# Patient Record
Sex: Male | Born: 1955 | Race: White | Hispanic: No | Smoking: Never smoker
Health system: Southern US, Community
[De-identification: ages and names within clinical notes are randomized; demographics above are authoritative.]

## PROBLEM LIST (undated history)

## (undated) ENCOUNTER — Ambulatory Visit (HOSPITAL_BASED_OUTPATIENT_CLINIC_OR_DEPARTMENT_OTHER)

## (undated) DIAGNOSIS — N442 Benign cyst of testis: Secondary | ICD-10-CM

## (undated) DIAGNOSIS — R7303 Prediabetes: Secondary | ICD-10-CM

## (undated) DIAGNOSIS — E785 Hyperlipidemia, unspecified: Secondary | ICD-10-CM

## (undated) DIAGNOSIS — I10 Essential (primary) hypertension: Secondary | ICD-10-CM

## (undated) DIAGNOSIS — G4733 Obstructive sleep apnea (adult) (pediatric): Secondary | ICD-10-CM

## (undated) DIAGNOSIS — R7309 Other abnormal glucose: Secondary | ICD-10-CM

## (undated) DIAGNOSIS — F5221 Male erectile disorder: Secondary | ICD-10-CM

## (undated) DIAGNOSIS — R2243 Localized swelling, mass and lump, lower limb, bilateral: Secondary | ICD-10-CM

## (undated) DIAGNOSIS — E669 Obesity, unspecified: Secondary | ICD-10-CM

## (undated) DIAGNOSIS — E291 Testicular hypofunction: Secondary | ICD-10-CM

## (undated) HISTORY — DX: Hyperlipidemia, unspecified: E78.5

## (undated) HISTORY — DX: Other abnormal glucose: R73.09

## (undated) HISTORY — DX: Obstructive sleep apnea (adult) (pediatric): G47.33

## (undated) HISTORY — DX: Testicular hypofunction: E29.1

## (undated) HISTORY — DX: Localized swelling, mass and lump, lower limb, bilateral: R22.43

## (undated) HISTORY — DX: Essential (primary) hypertension: I10

## (undated) HISTORY — DX: Male erectile disorder: F52.21

## (undated) HISTORY — DX: Prediabetes: R73.03

## (undated) HISTORY — PX: KNEE ARTHROSCOPY: SUR90

## (undated) HISTORY — DX: Benign cyst of testis: N44.2

## (undated) HISTORY — DX: Obesity, unspecified: E66.9

---

## 1990-12-06 HISTORY — PX: VASECTOMY: SHX75

## 2016-01-27 DIAGNOSIS — R7309 Other abnormal glucose: Secondary | ICD-10-CM

## 2016-01-27 DIAGNOSIS — E785 Hyperlipidemia, unspecified: Secondary | ICD-10-CM

## 2016-01-27 DIAGNOSIS — G4733 Obstructive sleep apnea (adult) (pediatric): Secondary | ICD-10-CM

## 2016-01-27 HISTORY — DX: Hyperlipidemia, unspecified: E78.5

## 2016-01-27 HISTORY — DX: Other abnormal glucose: R73.09

## 2016-01-27 HISTORY — DX: Obstructive sleep apnea (adult) (pediatric): G47.33

## 2016-02-02 DIAGNOSIS — E669 Obesity, unspecified: Secondary | ICD-10-CM

## 2016-02-02 DIAGNOSIS — E291 Testicular hypofunction: Secondary | ICD-10-CM

## 2016-02-02 DIAGNOSIS — N442 Benign cyst of testis: Secondary | ICD-10-CM

## 2016-02-02 DIAGNOSIS — F5221 Male erectile disorder: Secondary | ICD-10-CM

## 2016-02-02 HISTORY — DX: Male erectile disorder: F52.21

## 2016-02-02 HISTORY — DX: Obesity, unspecified: E66.9

## 2016-02-02 HISTORY — DX: Testicular hypofunction: E29.1

## 2016-02-02 HISTORY — DX: Benign cyst of testis: N44.2

## 2018-08-01 DIAGNOSIS — I119 Hypertensive heart disease without heart failure: Secondary | ICD-10-CM | POA: Insufficient documentation

## 2018-08-01 DIAGNOSIS — I10 Essential (primary) hypertension: Secondary | ICD-10-CM

## 2018-08-01 DIAGNOSIS — R2243 Localized swelling, mass and lump, lower limb, bilateral: Secondary | ICD-10-CM

## 2018-08-01 HISTORY — DX: Essential (primary) hypertension: I10

## 2018-08-01 HISTORY — DX: Localized swelling, mass and lump, lower limb, bilateral: R22.43

## 2018-08-07 NOTE — Progress Notes (Signed)
Cardiology Office Note:    Date:  08/08/2018   ID:  George Hatfield, DOB 04/16/1956, MRN [id number]  PCP:  Everlean Cherry, MD  Cardiologist:  Norman Herrlich, MD    Referring MD: Everlean Cherry, MD    ASSESSMENT:    1. SOB (shortness of breath)   2. Hypertension, essential   3. Pure hyperglyceridemia   4. OSA (obstructive sleep apnea)    PLAN:    In order of problems listed above:  1. Shortness of breath this is anginal equivalent we reviewed the options for evaluation including stress modalities or cardiac CTA and after discussion of options benefits and risk he elects to undergo cardiac CTA.  He has no dye allergy is normal renal function.  If abnormal I will bring him back to my office for discussion results if normal to see me as needed. 2. Stable continue current treatment 3. Continue his current over-the-counter fish oil and does not achieve target I would use prescription Lovaza or vascepa 4. Stable continue CPAP managed by pulmonary   Next appointment: As needed   Medication Adjustments/Labs and Tests Ordered: Current medicines are reviewed at length with the patient today.  Concerns regarding medicines are outlined above.  Orders Placed This Encounter  Procedures  . CT CORONARY MORPH W/CTA COR W/SCORE W/CA W/CM &/OR WO/CM  . CT CORONARY FRACTIONAL FLOW RESERVE DATA PREP  . CT CORONARY FRACTIONAL FLOW RESERVE FLUID ANALYSIS  . EKG 12-Lead   No orders of the defined types were placed in this encounter.   Chief Complaint  Patient presents with  . Shortness of Breath    History of Present Illness:    George Hatfield is a 62 y.o. male with a hx of hypertension, obstructive sleep apnea on CPAP and exertional SOB  last seen by me more than 3 years ago. Compliance with diet, lifestyle and medications: Yes, he has started over-the-counter fish oil for high triglycerides  This [place] while [place] he had an episode that raise concern for heart disease.  He  developed severe limiting exertional shortness of breath that forced him to stop pause and every time he did start activities it would recur.  He does not have shortness of breath other times he had no cough or wheezing was unable to keep up with his wife.  His cardiovascular risk factors include hypertension elevated triglycerides and sleep apnea which is treated and compliant with CPAP.  He has had no typical exertional angina his symptoms certainly sound like anginal equivalent shortness of breath.  He has no history of congenital rheumatic heart disease edema orthopnea.  No history of lung disease. Past Medical History:  Diagnosis Date  . Abnormal glucose 01/27/2016  . 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 Hyperlipidemia 01/27/2016  . Hypertension, essential 08/01/2018  . Localized swelling of both lower legs 08/01/2018  . 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 Obesity, unspecified 02/02/2016  . OSA (obstructive sleep apnea) 01/27/2016   PSG 06/01/16 AHI 11.7  . 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 KNEE ARTHROSCOPY    . VASECTOMY  1992    Current Medications: Current Meds  Medication Sig  . Cholecalciferol (VITAMIN D PO) Take 1 tablet by mouth daily.  . Coenzyme Q10 (CO Q 10 PO) Take 1 tablet by mouth daily.  . Cyanocobalamin (VITAMIN B-12 PO) Take 1 tablet by mouth daily.  Marland Kitchen lisinopril (PRINIVIL,ZESTRIL) 20 MG tablet TAKE ONE TABLET  BY MOUTH EVERY DAY  . Multiple Vitamins-Minerals (ZINC PO) Take 1 tablet by mouth daily.  . Nutritional Supplements (DHEA PO) Take 1 tablet by mouth daily.  . Omega-3 Fatty Acids (FISH OIL) 1000 MG CAPS Take 1 capsule by mouth daily.   . Pregnenolone POWD Take 1 tablet by mouth daily.  Marland Kitchen VITAMIN K PO Take 1 tablet by mouth daily.     Allergies:   Patient has no known allergies.   Social History   Socioeconomic History  . Marital status: Married    Spouse name: Not on file  . Number of children:  Not on file  . Years of education: Not on file  . Highest education level: Not on file  Occupational History  . Not on file  Social Needs  . Financial resource strain: Not on file  . Food insecurity:    Worry: Not on file    Inability: Not on file  . Transportation needs:    Medical: Not on file    Non-medical: Not on file  Tobacco Use  . Smoking status: Never Smoker  . Smokeless tobacco: Never Used  Substance and Sexual Activity  . Alcohol use: Yes    Frequency: Never    Comment: 4 drinks per week  . Drug use: Never  . Sexual activity: Not on file  Lifestyle  . Physical activity:    Days per week: Not on file    Minutes per session: Not on file  . Stress: Not on file  Relationships  . Social connections:    Talks on phone: Not on file    Gets together: Not on file    Attends religious service: Not on file    Active member of club or organization: Not on file    Attends meetings of clubs or organizations: Not on file    Relationship status: Not on file  Other Topics Concern  . Not on file  Social History Narrative  . Not on file     Family History: The patient's family history includes Alzheimer's disease in his father; Breast cancer in his mother; Diabetes in his father and mother; Hypertension in his mother; Stroke in his father. ROS:   Please see the history of present illness.    All other systems reviewed and are negative.  EKGs/Labs/Other Studies Reviewed:    The following studies were reviewed today:  EKG:  EKG ordered today.  The ekg ordered today demonstrates sinus rhythm normal  Recent Labs:   08/01/18 CMP normal except Glu 107, Chol 152 TG 684 HDL 43 LDL 82  CBC normal   Physical Exam:    VS:  BP (!) 142/86 (BP Location: Left Arm, Patient Position: Sitting, Cuff Size: Large)   Pulse (!) 49   Ht 6\' 3"  (1.905 m)   Wt 266 lb (120.7 kg)   SpO2 96%   BMI 33.25 kg/m     Wt Readings from Last 3 Encounters:  08/08/18 266 lb (120.7 kg)     GEN:   Well nourished, well developed in no acute distress HEENT: Normal NECK: No JVD; No carotid bruits LYMPHATICS: No lymphadenopathy CARDIAC: RRR, no murmurs, rubs, gallops RESPIRATORY:  Clear to auscultation without rales, wheezing or rhonchi  ABDOMEN: Soft, non-tender, non-distended MUSCULOSKELETAL:  No edema; No deformity  SKIN: Warm and dry NEUROLOGIC:  Alert and oriented x 3 PSYCHIATRIC:  Normal affect    Signed, Norman Herrlich, MD  08/08/2018 11:33 AM    [facility]

## 2018-08-08 ENCOUNTER — Ambulatory Visit: Payer: BLUE CROSS/BLUE SHIELD | Admitting: Cardiology

## 2018-08-08 ENCOUNTER — Encounter (INDEPENDENT_AMBULATORY_CARE_PROVIDER_SITE_OTHER): Payer: Self-pay

## 2018-08-08 ENCOUNTER — Encounter: Payer: Self-pay | Admitting: Cardiology

## 2018-08-08 VITALS — BP 142/86 | HR 49 | Ht 75.0 in | Wt 266.0 lb

## 2018-08-08 DIAGNOSIS — R0602 Shortness of breath: Secondary | ICD-10-CM | POA: Diagnosis not present

## 2018-08-08 DIAGNOSIS — I10 Essential (primary) hypertension: Secondary | ICD-10-CM | POA: Diagnosis not present

## 2018-08-08 DIAGNOSIS — E781 Pure hyperglyceridemia: Secondary | ICD-10-CM | POA: Diagnosis not present

## 2018-08-08 DIAGNOSIS — G4733 Obstructive sleep apnea (adult) (pediatric): Secondary | ICD-10-CM

## 2018-08-08 DIAGNOSIS — R079 Chest pain, unspecified: Secondary | ICD-10-CM | POA: Insufficient documentation

## 2018-08-08 DIAGNOSIS — Z01818 Encounter for other preprocedural examination: Secondary | ICD-10-CM

## 2018-08-08 MED ORDER — METOPROLOL TARTRATE 50 MG PO TABS: 50.0000 mg | ORAL_TABLET | Freq: Once | ORAL | 0 refills | Status: DC

## 2018-08-08 NOTE — Patient Instructions (Addendum)
Medication Instructions:  Your physician recommends that you continue on your current medications as directed. Please refer to the Current Medication list given to you today.   Labwork: Your physician recommends that you return for lab work in 3-7 days before cardiac CTA: BMP. You can return to our office, no appointment needed. We are open M-F 8-5.   Testing/Procedures: You had an EKG today.   Please arrive at the [facility] main entrance of [facility] at xx:xx AM (30-45 minutes prior to test start time)  [facility] [address] [place], [place] [address] [phone]  Proceed to the [facility] Radiology Department (First Floor).  Please follow these instructions carefully (unless otherwise directed):  Hold all erectile dysfunction medications at least 48 hours prior to test.  On the Night Before the Test: . Drink plenty of water. . Do not consume any caffeinated/decaffeinated beverages or chocolate 12 hours prior to your test. . Do not take any antihistamines 12 hours prior to your test.  On the Day of the Test: . Drink plenty of water. Do not drink any water within one hour of the test. . Do not eat any food 4 hours prior to the test. . You may take your regular medications prior to the test. . IF NOT ON A BETA BLOCKER - Take 50 mg of lopressor (metoprolol) one hour before the test.  After the Test: . Drink plenty of water. . After receiving IV contrast, you may experience a mild flushed feeling. This is normal. . On occasion, you may experience a mild rash up to 24 hours after the test. This is not dangerous. If this occurs, you can take Benadryl 25 mg and increase your fluid intake. . If you experience trouble breathing, this can be serious. If it is severe call 911 IMMEDIATELY. If it is mild, please call our office.   Follow-Up: Your physician recommends that you schedule a follow-up appointment as needed if symptoms fail or worsen to  improve.   If you need a refill on your cardiac medications before your next appointment, please call your pharmacy.   Thank you for choosing [facility]! Mady Gemma, RN [phone]   Cardiac CT Angiogram A cardiac CT angiogram is a procedure to look at the heart and the area around the heart. It may be done to help find the cause of chest pains or other symptoms of heart disease. During this procedure, a large X-ray machine, called a CT scanner, takes detailed pictures of the heart and the surrounding area after a dye (contrast material) has been injected into blood vessels in the area. The procedure is also sometimes called a coronary CT angiogram, coronary artery scanning, or CTA. A cardiac CT angiogram allows the health care provider to see how well blood is flowing to and from the heart. The health care provider will be able to see if there are any problems, such as:  Blockage or narrowing of the coronary arteries in the heart.  Fluid around the heart.  Signs of weakness or disease in the muscles, valves, and tissues of the heart.  Tell a health care provider about:  Any allergies you have. This is especially important if you have had a previous allergic reaction to contrast dye.  All medicines you are taking, including vitamins, herbs, eye drops, creams, and over-the-counter medicines.  Any blood disorders you have.  Any surgeries you have had.  Any medical conditions you have.  Whether you are pregnant  or may be pregnant.  Any anxiety disorders, chronic pain, or other conditions you have that may increase your stress or prevent you from lying still. What are the risks? Generally, this is a safe procedure. However, problems may occur, including:  Bleeding.  Infection.  Allergic reactions to medicines or dyes.  Damage to other structures or organs.  Kidney damage from the dye or contrast that is used.  Increased risk of cancer from radiation exposure.  This risk is low. Talk with your health care provider about: ? The risks and benefits of testing. ? How you can receive the lowest dose of radiation.  What happens before the procedure?  Wear comfortable clothing and remove any jewelry, glasses, dentures, and hearing aids.  Follow instructions from your health care provider about eating and drinking. This may include: ? For 12 hours before the test - avoid caffeine. This includes tea, coffee, soda, energy drinks, and diet pills. Drink plenty of water or other fluids that do not have caffeine in them. Being well-hydrated can prevent complications. ? For 4-6 hours before the test - stop eating and drinking. The contrast dye can cause nausea, but this is less likely if your stomach is empty.  Ask your health care provider about changing or stopping your regular medicines. This is especially important if you are taking diabetes medicines, blood thinners, or medicines to treat erectile dysfunction. What happens during the procedure?  Hair on your chest may need to be removed so that small sticky patches called electrodes can be placed on your chest. These will transmit information that helps to monitor your heart during the test.  An IV tube will be inserted into one of your veins.  You might be given a medicine to control your heart rate during the test. This will help to ensure that good images are obtained.  You will be asked to lie on an exam table. This table will slide in and out of the CT machine during the procedure.  Contrast dye will be injected into the IV tube. You might feel warm, or you may get a metallic taste in your mouth.  You will be given a medicine (nitroglycerin) to relax (dilate) the arteries in your heart.  The table that you are lying on will move into the CT machine tunnel for the scan.  The person running the machine will give you instructions while the scans are being done. You may be asked to: ? Keep your arms  above your head. ? Hold your breath. ? Stay very still, even if the table is moving.  When the scanning is complete, you will be moved out of the machine.  The IV tube will be removed. The procedure may vary among health care providers and hospitals. What happens after the procedure?  You might feel warm, or you may get a metallic taste in your mouth from the contrast dye.  You may have a headache from the nitroglycerin.  After the procedure, drink water or other fluids to wash (flush) the contrast material out of your body.  Contact a health care provider if you have any symptoms of allergy to the contrast. These symptoms include: ? Shortness of breath. ? Rash or hives. ? A racing heartbeat.  Most people can return to their normal activities right after the procedure. Ask your health care provider what activities are safe for you.  It is up to you to get the results of your procedure. Ask your health care provider, or  the department that is doing the procedure, when your results will be ready. Summary  A cardiac CT angiogram is a procedure to look at the heart and the area around the heart. It may be done to help find the cause of chest pains or other symptoms of heart disease.  During this procedure, a large X-ray machine, called a CT scanner, takes detailed pictures of the heart and the surrounding area after a dye (contrast material) has been injected into blood vessels in the area.  Ask your health care provider about changing or stopping your regular medicines before the procedure. This is especially important if you are taking diabetes medicines, blood thinners, or medicines to treat erectile dysfunction.  After the procedure, drink water or other fluids to wash (flush) the contrast material out of your body. This information is not intended to replace advice given to you by your health care provider. Make sure you discuss any questions you have with your health care  provider. Document Released: 11/04/2008 Document Revised: 10/11/2016 Document Reviewed: 10/11/2016 Elsevier Interactive Patient Education  2017 [employer].

## 2018-09-05 ENCOUNTER — Ambulatory Visit (HOSPITAL_COMMUNITY)
Admission: RE | Admit: 2018-09-05 | Discharge: 2018-09-05 | Disposition: A | Discharge status: Home / Self Care | Payer: BLUE CROSS/BLUE SHIELD | Source: Ambulatory Visit | Attending: Cardiology | Admitting: Cardiology

## 2018-09-05 ENCOUNTER — Ambulatory Visit (HOSPITAL_COMMUNITY): Payer: BLUE CROSS/BLUE SHIELD

## 2018-09-05 DIAGNOSIS — I288 Other diseases of pulmonary vessels: Secondary | ICD-10-CM | POA: Diagnosis not present

## 2018-09-05 DIAGNOSIS — I10 Essential (primary) hypertension: Secondary | ICD-10-CM | POA: Diagnosis not present

## 2018-09-05 DIAGNOSIS — R0602 Shortness of breath: Secondary | ICD-10-CM | POA: Insufficient documentation

## 2018-09-05 LAB — POCT I-STAT CREATININE: CREATININE: 0.9 mg/dL (ref 0.61–1.24)

## 2018-09-05 MED ORDER — NITROGLYCERIN 0.4 MG SL SUBL
SUBLINGUAL_TABLET | SUBLINGUAL | Status: AC
  Filled 2018-09-05: qty 2

## 2018-09-05 MED ORDER — NITROGLYCERIN 0.4 MG SL SUBL
0.8000 mg | SUBLINGUAL_TABLET | Freq: Once | SUBLINGUAL | Status: AC
  Administered 2018-09-05: 0.8 mg via SUBLINGUAL

## 2018-09-05 MED ORDER — IOPAMIDOL (ISOVUE-370) INJECTION 76%
100.0000 mL | Freq: Once | INTRAVENOUS | Status: AC | PRN
  Administered 2018-09-05: 80 mL via INTRAVENOUS

## 2018-09-06 ENCOUNTER — Telehealth: Payer: Self-pay

## 2018-09-06 DIAGNOSIS — I2721 Secondary pulmonary arterial hypertension: Secondary | ICD-10-CM

## 2018-09-06 NOTE — Telephone Encounter (Signed)
Left message for patient to return call for Cardiac CTA results and to set up echocardiogram per Dr Dulce Sellar.  Order for echocardiogram has been entered.

## 2018-09-06 NOTE — Telephone Encounter (Signed)
-----   Message from Baldo Daub, MD sent at 09/05/2018  3:35 PM EDT ----- Normal or stable result  His study is normal, no calcification or CAD  Radiology recommends a cardiac echo re dilated pulmonary artery, please schedule

## 2018-09-07 NOTE — Telephone Encounter (Signed)
Patient informed of cardiac CTA results and advised that Dr. Dulce Sellar recommends an echocardiogram for further evaluation of dilated pulmonary artery. Patient agreeable and has been scheduled in [place] on 10/24/18 at 10:15 am for echocardiogram. No further questions.

## 2018-10-24 ENCOUNTER — Ambulatory Visit (INDEPENDENT_AMBULATORY_CARE_PROVIDER_SITE_OTHER): Payer: BLUE CROSS/BLUE SHIELD

## 2018-10-24 DIAGNOSIS — I2721 Secondary pulmonary arterial hypertension: Secondary | ICD-10-CM | POA: Diagnosis not present

## 2018-10-24 NOTE — Progress Notes (Signed)
Complete echocardiogram has been performed.  Jimmy Mc Hollen RDCS, RVT 

## 2018-12-15 DIAGNOSIS — I119 Hypertensive heart disease without heart failure: Secondary | ICD-10-CM | POA: Insufficient documentation

## 2018-12-15 DIAGNOSIS — I5189 Other ill-defined heart diseases: Secondary | ICD-10-CM | POA: Insufficient documentation

## 2018-12-15 DIAGNOSIS — Z5320 Procedure and treatment not carried out because of patient's decision for unspecified reasons: Secondary | ICD-10-CM | POA: Insufficient documentation

## 2019-02-14 DIAGNOSIS — E119 Type 2 diabetes mellitus without complications: Secondary | ICD-10-CM | POA: Insufficient documentation

## 2019-05-16 IMAGING — CT CT HEART MORP W/ CTA COR W/ SCORE W/ CA W/CM &/OR W/O CM
4 of 7 series · 8 of 20 positions shown, 9 images · non-contrast
Comparison: None.

CLINICAL DATA: 61-year-old male with hypertension and shortness of
breath.

EXAM:
Cardiac/Coronary  CT
TECHNIQUE: The patient was scanned on a Phillips Force scanner.

[Series 6: best diast 68 % · axial · 0.42mm/px · z∈[+1333,+1377]mm · 2 of 331 slices shown, 3 images]
[im 111/331  vessel]
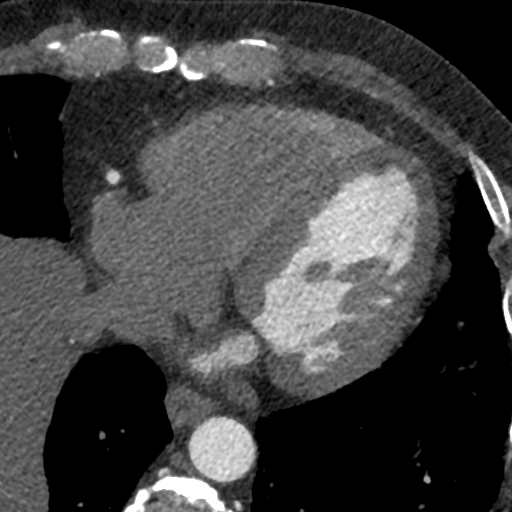
[im 111/331  lung]
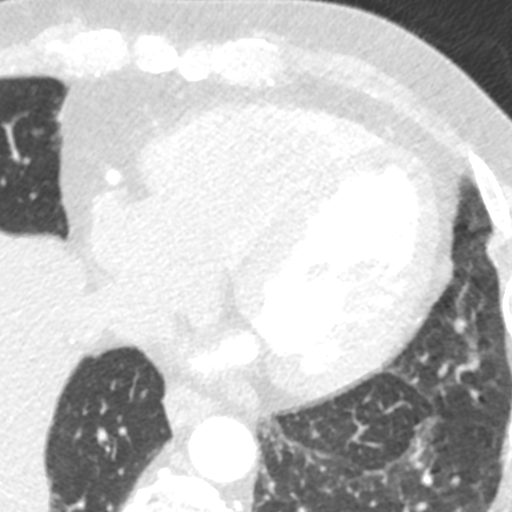
[im 221/331  vessel]
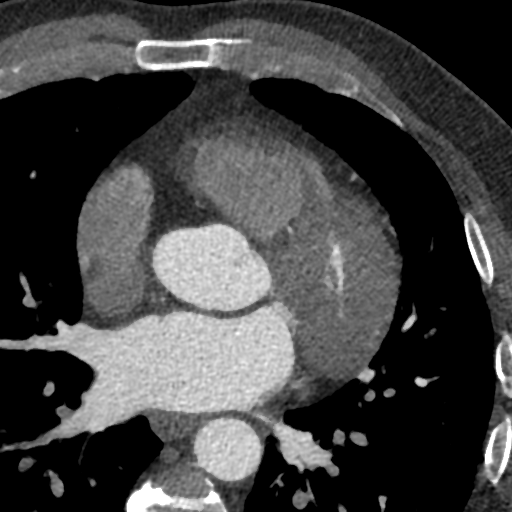

[Series 7: best syst 38 % · axial · 0.42mm/px · z∈[+1333,+1377]mm · 2 of 331 slices shown]
[im 111/331  vessel]
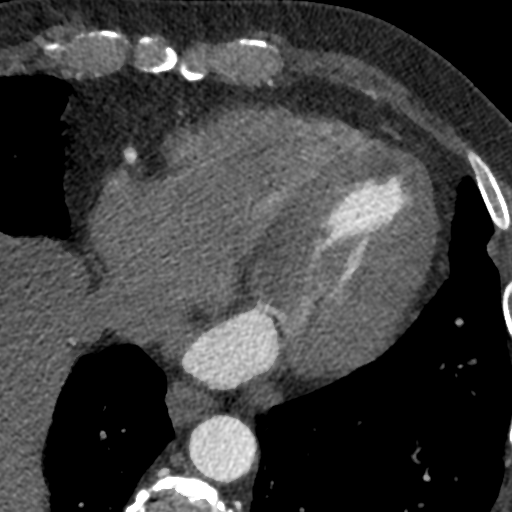
[im 221/331  vessel]
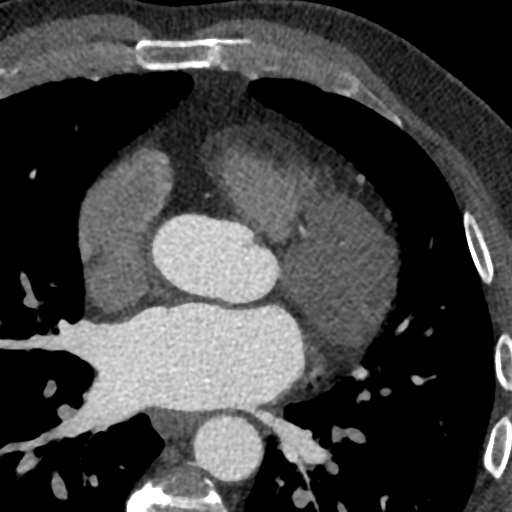

[Series 8: ts diast sharp 68 % · axial · 0.42mm/px · z∈[+1333,+1377]mm · 2 of 331 slices shown]
[im 111/331  lung]
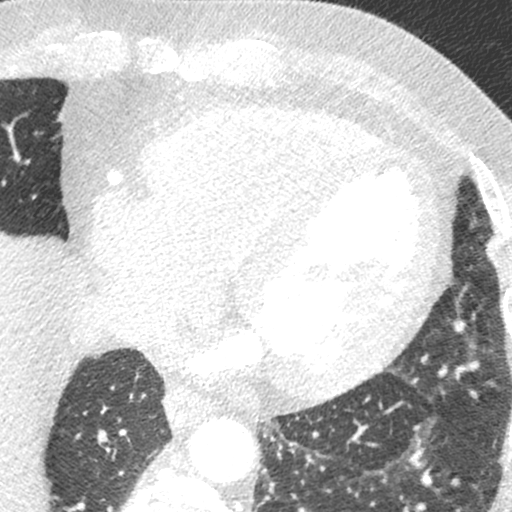
[im 221/331  lung]
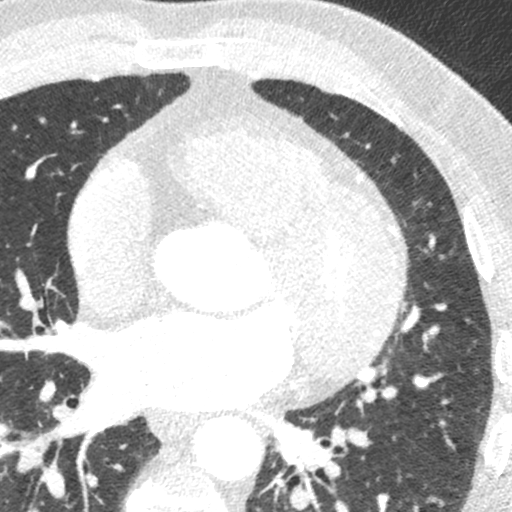

[Series 9: ts syst sharp 38 % · axial · 0.42mm/px · z∈[+1333,+1377]mm · 2 of 331 slices shown]
[im 111/331  lung]
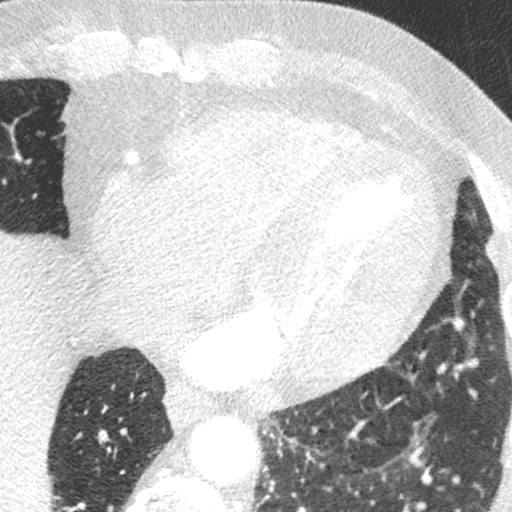
[im 221/331  lung]
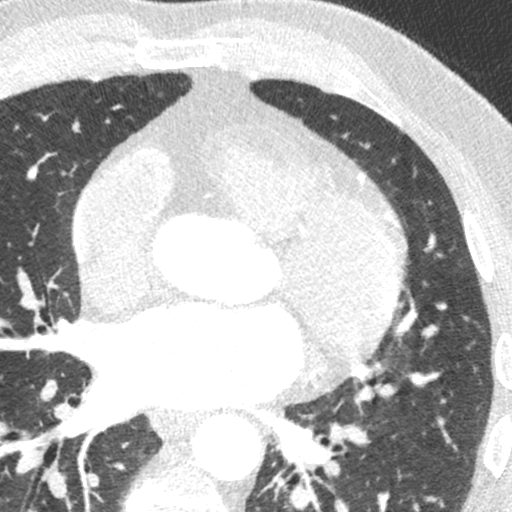

[8 of 20 positions shown; findings below may reference images not displayed]



Aorta:  Normal size.  No calcifications.  No dissection.

Aortic Valve:  Trileaflet.  No calcifications.

Coronary Arteries:  Normal coronary origin.  Right dominance.

RCA is a large dominant artery that gives rise to PDA and PLVB.
There is no plaque.

Left main is a large artery that gives rise to LAD and LCX arteries.
Left main has no plaque.

LAD is a medium size artery that gives rise to one diagonal artery
and has no plaque.

LCX is a non-dominant artery that gives rise to one OM1 branch.
There is no plaque.

Other findings:

Normal pulmonary vein drainage into the left atrium.

Normal let atrial appendage without a thrombus.
IMPRESSION: 1. Coronary calcium score of 0. This was 0 percentile for age and
sex matched control.

2. Normal coronary origin with right dominance.

3. No evidence of CAD.

4. Mildly dilated pulmonary artery measuring 34 mm suggestive of
pulmonary hypertension. An echocardiogram is recommended for further
evaluation.

EXAM:
OVER-READ INTERPRETATION  CT CHEST

The following report is an over-read performed by radiologist Dr.
Joaquim Henrique Palavras [[email or web address]] on 09/05/2018. This over-read
does not include interpretation of cardiac or coronary anatomy or
pathology. The coronary CTA interpretation by the cardiologist is
attached.
FINDINGS: Vascular: Heart is normal size.  Visualized aorta is normal caliber.

Mediastinum/Nodes: No adenopathy in the lower mediastinum or hila.

Lungs/Pleura: No confluent airspace opacities or effusions.

Upper Abdomen: Imaging into the upper abdomen shows no acute
findings.

Musculoskeletal: Chest wall soft tissues are unremarkable. No acute
bony abnormality.
IMPRESSION: No acute or significant extracardiac abnormality.

## 2019-12-11 ENCOUNTER — Telehealth: Payer: Self-pay | Admitting: Cardiology

## 2019-12-11 NOTE — Telephone Encounter (Signed)
YOUR CARDIOLOGY TEAM HAS ARRANGED FOR AN E-VISIT FOR YOUR APPOINTMENT - PLEASE REVIEW IMPORTANT INFORMATION BELOW SEVERAL DAYS PRIOR TO YOUR APPOINTMENT  Due to the recent COVID-19 pandemic, we are transitioning in-person office visits to tele-medicine visits in an effort to decrease unnecessary exposure to our patients, their families, and staff. These visits are billed to your insurance just like a normal visit is. We also encourage you to sign up for MyChart if you have not already done so. You will need a smartphone if possible. For patients that do not have this, we can still complete the visit using a regular telephone but do prefer a smartphone to enable video when possible. You may have a family member that lives with you that can help. If possible, we also ask that you have a blood pressure cuff and scale at home to measure your blood pressure, heart rate and weight prior to your scheduled appointment. Patients with clinical needs that need an in-person evaluation and testing will still be able to come to the office if absolutely necessary. If you have any questions, feel free to call our office.     YOUR PROVIDER WILL BE USING THE FOLLOWING PLATFORM TO COMPLETE YOUR VISIT: doxy me  . IF USING MYCHART - How to Download the MyChart App to Your SmartPhone   - If Apple, go to [employer] and type in MyChart in the search bar and download the app. If Android, ask patient to go to [employer] and type in [place] in the search bar and download the app. The app is free but as with any other app downloads, your phone may require you to verify saved payment information or Apple/Android password.  - You will need to then log into the app with your MyChart username and password, and select [facility] as your healthcare provider to link the account.  - When it is time for your visit, go to the MyChart app, find appointments, and click Begin Video Visit. Be sure to Select Allow for your device to  access the Microphone and Camera for your visit. You will then be connected, and your provider will be with you shortly.  **If you have any issues connecting or need assistance, please contact MyChart service desk [phone])**  **If using a computer, in order to ensure the best quality for your visit, you will need to use either of the following Internet Browsers: Agricultural consultant or [employer]**  . IF USING DOXIMITY or [email or web address] - The staff will give you instructions on receiving your link to join the meeting the day of your visit.      THE DAY OF YOUR APPOINTMENT  Approximately 15 minutes prior to your scheduled appointment, you will receive a telephone call from one of [facility] team - your caller ID may say "Unknown caller."  Our staff will confirm medications, vital signs for the day and any symptoms you may be experiencing. Please have this information available prior to the time of visit start. It may also be helpful for you to have a pad of paper and pen handy for any instructions given during your visit. They will also walk you through joining the smartphone meeting if this is a video visit.    CONSENT FOR TELE-HEALTH VISIT - PLEASE REVIEW  I hereby voluntarily request, consent and authorize [facility] and its employed or contracted physicians, physician assistants, nurse practitioners or other licensed health care professionals (the Practitioner), to provide me with telemedicine health  care services (the "Services") as deemed necessary by the treating Practitioner. I acknowledge and consent to receive the Services by the Practitioner via telemedicine. I understand that the telemedicine visit will involve communicating with the Practitioner through live audiovisual communication technology and the disclosure of certain medical information by electronic transmission. I acknowledge that I have been given the opportunity to request an in-person assessment or other  available alternative prior to the telemedicine visit and am voluntarily participating in the telemedicine visit.  I understand that I have the right to withhold or withdraw my consent to the use of telemedicine in the course of my care at any time, without affecting my right to future care or treatment, and that the Practitioner or I may terminate the telemedicine visit at any time. I understand that I have the right to inspect all information obtained and/or recorded in the course of the telemedicine visit and may receive copies of available information for a reasonable fee.  I understand that some of the potential risks of receiving the Services via telemedicine include:  Marland Kitchen Delay or interruption in medical evaluation due to technological equipment failure or disruption; . Information transmitted may not be sufficient (e.g. poor resolution of images) to allow for appropriate medical decision making by the Practitioner; and/or  . In rare instances, security protocols could fail, causing a breach of personal health information.  Furthermore, I acknowledge that it is my responsibility to provide information about my medical history, conditions and care that is complete and accurate to the best of my ability. I acknowledge that Practitioner's advice, recommendations, and/or decision may be based on factors not within their control, such as incomplete or inaccurate data provided by me or distortions of diagnostic images or specimens that may result from electronic transmissions. I understand that the practice of medicine is not an exact science and that Practitioner makes no warranties or guarantees regarding treatment outcomes. I acknowledge that I will receive a copy of this consent concurrently upon execution via email to the email address I last provided but may also request a printed copy by calling the office of [place].    I understand that my insurance will be billed for this visit.   I have  read or had this consent read to me. . I understand the contents of this consent, which adequately explains the benefits and risks of the Services being provided via telemedicine.  . I have been provided ample opportunity to ask questions regarding this consent and the Services and have had my questions answered to my satisfaction. . I give my informed consent for the services to be provided through the use of telemedicine in my medical care  By participating in this telemedicine visit I agree to the above.

## 2019-12-11 NOTE — Progress Notes (Signed)
Virtual Visit via Telephone Note   This visit type was conducted due to national recommendations for restrictions regarding the COVID-19 Pandemic (e.g. social distancing) in an effort to limit this patient's exposure and mitigate transmission in our community.  Due to his co-morbid illnesses, this patient is at least at moderate risk for complications without adequate follow up.  This format is felt to be most appropriate for this patient at this time.  The patient did not have access to video technology/had technical difficulties with video requiring transitioning to audio format only (telephone).  All issues noted in this document were discussed and addressed.  No physical exam could be performed with this format.  Please refer to the patient's chart for his  consent to telehealth for [facility]. Date:  12/12/2019   Despite coaching and attempts he is unable to establish a video link and we did a telephone visit.  ID:  George Hatfield, George Hatfield 06-28-1956, MRN [id number]  PCP:  Everlean Cherry, MD  Cardiologist:  Norman Herrlich, MD    Referring MD: Everlean Cherry, MD    ASSESSMENT:    1. Atrial fibrillation, unspecified type (HCC)   2. Hypertension, essential   3. Pure hyperglyceridemia    PLAN:    In order of problems listed above:  1. Clinically I suspect he is having atrial fibrillation likely persistent at this point we cannot do an EKG we'll give him on 1 week CO monitor to attempt to correlate symptoms with his apple watch warning.  If atrial fibrillation will need anticoagulation and consideration of antiarrhythmic drug and cardioversion 2. 10 he is antihypertensive 3. Stable continue current treatment including pravastatin.   Next appointment: To be determined after seeing the ZIO monitor   Medication Adjustments/Labs and Tests Ordered: Current medicines are reviewed at length with the patient today.  Concerns regarding medicines are outlined above.  Orders Placed This  Encounter  Procedures  . LONG TERM MONITOR (3-14 DAYS)   No orders of the defined types were placed in this encounter.   No chief complaint on file.   History of Present Illness:    George Hatfield is a 64 y.o. male with a hx of  hypertension, obstructive sleep apnea on CPAP and exertional SOB    He was last seen 08/08/2018. Compliance with diet, lifestyle and medications: Yes You the test below with the patient during the visit  Cardiac CTA 09/05/2018: IMPRESSION: 1. Coronary calcium score of 0. This was 0 percentile for age and sex matched control. 2. Normal coronary origin with right dominance. 3. No evidence of CAD. 4. Mildly dilated pulmonary artery measuring 34 mm suggestive of pulmonary hypertension  Echo 10/24/2018:   - Left ventricle: The cavity size was normal. Wall thickness was   increased in a pattern of moderate LVH. Systolic function was   normal. The estimated ejection fraction was in the range of 60%   to 65%. Wall motion was normal; there were no regional wall   motion abnormalities. Doppler parameters are consistent with   abnormal left ventricular relaxation (grade 1 diastolic   dysfunction). - Aortic valve: There was mild regurgitation. -Right ventricle:  The cavity size was normal. Wall thickness was normal. Systolic function was normal. Tricuspid valve:   Structurally normal valve.    Doppler: Transvalvular velocity was within the normal range. There was no regurgitation.-  For the last month his heart rate initially on a Fitbit and now an apple watch for  the last 3 weeks has been erratic 50 to 110 bpm and multiple times a day gets a warning his atrial fibrillation.  Unfortunately no COVID-19 exposure yesterday close contact and is in quarantine awaiting his test in another 4 days.  For further evaluation I'll slip by his home today and leave a ZIO monitor on his doorstep for 7 days of atrial fibrillation as documented you require an antiarrhythmic drug  and anticoagulation.  In retrospect his episode last year of shortness of breath may have been atrial fibrillation that we didn't capture.  At this time he has no fever chills signs of COVID-19 no edema shortness of breath chest pain or syncope.  Having palpitation is aware of his pulse being irregular Past Medical History:  Diagnosis Date  . Abnormal glucose 01/27/2016  . 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 Hyperlipidemia 01/27/2016  . Hypertension, essential 08/01/2018  . Localized swelling of both lower legs 08/01/2018  . 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 Obesity, unspecified 02/02/2016  . OSA (obstructive sleep apnea) 01/27/2016   PSG 06/01/16 AHI 11.7  . 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 KNEE ARTHROSCOPY    . VASECTOMY  1992    Current Medications: Current Meds  Medication Sig  . Cholecalciferol (VITAMIN D PO) Take 1 tablet by mouth daily.  . Coenzyme Q10 (CO Q 10 PO) Take 1 tablet by mouth daily.  . Cyanocobalamin (VITAMIN B-12 PO) Take 1 tablet by mouth daily.  Marland Kitchen lisinopril (PRINIVIL,ZESTRIL) 20 MG tablet TAKE ONE TABLET BY MOUTH EVERY DAY  . metFORMIN (GLUCOPHAGE) 500 MG tablet Take 1 tablet by mouth daily.  . Multiple Vitamins-Minerals (ZINC PO) Take 1 tablet by mouth daily.  . Nutritional Supplements (DHEA PO) Take 1 tablet by mouth daily.  . Omega-3 Fatty Acids (FISH OIL) 1000 MG CAPS Take 1 capsule by mouth daily.   . tadalafil (CIALIS) 5 MG tablet Take 1 tablet by mouth daily.  Marland Kitchen VITAMIN K PO Take 1 tablet by mouth daily.     Allergies:   Patient has no known allergies.   Social History   Socioeconomic History  . Marital status: Legally Separated    Spouse name: Not on file  . Number of children: Not on file  . Years of education: Not on file  . Highest education level: Not on file  Occupational History  . Not on file  Tobacco Use  . Smoking status: Never Smoker  . Smokeless tobacco: Never Used   Substance and Sexual Activity  . Alcohol use: Yes    Comment: 4 drinks per week  . Drug use: Never  . Sexual activity: Not on file  Other Topics Concern  . Not on file  Social History Narrative  . Not on file   Social Determinants of Health   Financial Resource Strain:   . Difficulty of Paying Living Expenses: Not on file  Food Insecurity:   . Worried About Programme researcher, broadcasting/film/video in the Last Year: Not on file  . Ran Out of Food in the Last Year: Not on file  Transportation Needs:   . Lack of Transportation (Medical): Not on file  . Lack of Transportation (Non-Medical): Not on file  Physical Activity:   . Days of Exercise per Week: Not on file  . Minutes of Exercise per Session: Not on file  Stress:   . Feeling of Stress : Not on file  Social Connections:   .  Frequency of Communication with Friends and Family: Not on file  . Frequency of Social Gatherings with Friends and Family: Not on file  . Attends Religious Services: Not on file  . Active Member of Clubs or Organizations: Not on file  . Attends Archivist Meetings: Not on file  . Marital Status: Not on file     Family History: The patient's family history includes Alzheimer's disease in his father; Breast cancer in his mother; Diabetes in his father and mother; Hypertension in his mother; Stroke in his father. ROS:   Please see the history of present illness.    All other systems reviewed and are negative.  EKGs/Labs/Other Studies Reviewed:    The following studies were reviewed today:   Recent Labs: 08/02/2019 cholesterol 168 HDL 51 LDL 82 triglycerides 173.  Physical Exam:    VS:  BP (!) 139/93   Pulse (!) 48   Ht 6\' 3"  (1.905 m)   Wt 258 lb (117 kg)   SpO2 98%   BMI 32.25 kg/m     Wt Readings from Last 3 Encounters:  12/12/19 258 lb (117 kg)  08/08/18 266 lb (120.7 kg)     Vital signs reviewed He had no shortness of breath or respiratory distress during conversation he is alert and  oriented  Signed, Shirlee More, MD  12/12/2019 10:55 AM    [place]

## 2019-12-12 ENCOUNTER — Encounter: Payer: Self-pay | Admitting: Cardiology

## 2019-12-12 ENCOUNTER — Other Ambulatory Visit: Payer: Self-pay

## 2019-12-12 ENCOUNTER — Telehealth (INDEPENDENT_AMBULATORY_CARE_PROVIDER_SITE_OTHER): Payer: Self-pay | Admitting: Cardiology

## 2019-12-12 VITALS — BP 139/93 | HR 48 | Ht 75.0 in | Wt 258.0 lb

## 2019-12-12 DIAGNOSIS — I4891 Unspecified atrial fibrillation: Secondary | ICD-10-CM

## 2019-12-12 DIAGNOSIS — E781 Pure hyperglyceridemia: Secondary | ICD-10-CM

## 2019-12-12 DIAGNOSIS — I10 Essential (primary) hypertension: Secondary | ICD-10-CM

## 2019-12-12 NOTE — Patient Instructions (Signed)
Medication Instructions:  Your physician recommends that you continue on your current medications as directed. Please refer to the Current Medication list given to you today.  *If you need a refill on your cardiac medications before your next appointment, please call your pharmacy*  Lab Work: None ordered  If you have labs (blood work) drawn today and your tests are completely normal, you will receive your results only by: Marland Kitchen MyChart Message (if you have MyChart) OR . A paper copy in the mail If you have any lab test that is abnormal or we need to change your treatment, we will call you to review the results.  Testing/Procedures: Christena Deem- Long Term Monitor Instructions   Your physician has requested you wear your ZIO patch monitor 7 DAYS.   This is a single patch monitor.  [employer] supplies one patch monitor per enrollment.  Additional stickers are not available.   Please do not apply patch if you will be having a Nuclear Stress Test, Echocardiogram, Cardiac CT, MRI, or Chest Xray during the time frame you would be wearing the monitor. The patch cannot be worn during these tests.  You cannot remove and re-apply the ZIO XT patch monitor.   Your ZIO patch monitor will be sent USPS Priority mail from [facility] directly to your home address. The monitor may also be mailed to a PO BOX if home delivery is not available.   It may take 3-5 days to receive your monitor after you have been enrolled.   Once you have received you monitor, please review enclosed instructions.  Your monitor has already been registered assigning a specific monitor serial # to you.   Applying the monitor   Shave hair from upper left chest.   Hold abrader disc by orange tab.  Rub abrader in 40 strokes over left upper chest as indicated in your monitor instructions.   Clean area with 4 enclosed alcohol pads .  Use all pads to assure are is cleaned thoroughly.  Let dry.   Apply patch as indicated in monitor  instructions.  Patch will be place under collarbone on left side of chest with arrow pointing upward.   Rub patch adhesive wings for 2 minutes.Remove white label marked "1".  Remove white label marked "2".  Rub patch adhesive wings for 2 additional minutes.   While looking in a mirror, press and release button in center of patch.  A small green light will flash 3-4 times .  This will be your only indicator the monitor has been turned on.     Do not shower for the first 24 hours.  You may shower after the first 24 hours.   Press button if you feel a symptom. You will hear a small click.  Record Date, Time and Symptom in the Patient Log Book.   When you are ready to remove patch, follow instructions on last 2 pages of Patient Log Book.  Stick patch monitor onto last page of Patient Log Book.   Place Patient Log Book in [place] box.  Use locking tab on box and tape box closed securely.  The [employer] and [employer] has [employer] on it.  Please place in mailbox as soon as possible.  Your physician should have your test results approximately 7 days after the monitor has been mailed back to [facility].   Call [facility] Customer Care at [phone] if you have questions regarding your ZIO XT patch monitor.  Call them immediately if you see  an orange light blinking on your monitor.   If your monitor falls off in less than 4 days contact our Monitor department at [phone].  If your monitor becomes loose or falls off after 4 days call [employer] at [phone] for suggestions on securing your monitor.    Follow-Up: At [facility], you and your health needs are our priority.  As part of our continuing mission to provide you with exceptional heart care, we have created designated Provider Care Teams.  These Care Teams include your primary Cardiologist (physician) and Advanced Practice Providers (APPs -  Physician Assistants and Nurse Practitioners) who all work together to provide you with  the care you need, when you need it.  Your next appointment:   AS NEEDED

## 2019-12-13 ENCOUNTER — Ambulatory Visit (INDEPENDENT_AMBULATORY_CARE_PROVIDER_SITE_OTHER): Payer: BC Managed Care – PPO

## 2019-12-13 DIAGNOSIS — I4891 Unspecified atrial fibrillation: Secondary | ICD-10-CM

## 2019-12-31 NOTE — Progress Notes (Signed)
Cardiology Office Note:    Date:  01/01/2020   ID:  George Hatfield, DOB [birth year], MRN [id number]  PCP:  Maris Berger, MD  Cardiologist:  Shirlee More, MD    Referring MD: Maris Berger, MD    ASSESSMENT:    1. Atrial fibrillation, unspecified type ([place])    PLAN:    In order of problems listed above:  1. We will start a low-dose beta-blocker anticoagulant plan to cardiovert in 3 weeks and check routine labs including a TSH prior to cardioversion.  If successful initiate him on low-dose flecainide. 2. Stable hypertension continue treatment ACE inhibitor   Next appointment: 6 weeks   Medication Adjustments/Labs and Tests Ordered: Current medicines are reviewed at length with the patient today.  Concerns regarding medicines are outlined above.  No orders of the defined types were placed in this encounter.  No orders of the defined types were placed in this encounter.   No chief complaint on file.   History of Present Illness:    George Hatfield is a 64 y.o. male with a hx of atrial fibrillation last seen 12/12/2019. Compliance with diet, lifestyle and medications: Yes  I reviewed the studies below for the visit today.  I quickly reviewed his event monitor and he has rapid atrial fibrillation wide-complex rhythm is seen due to a aberrant conduction.  He notices that his heart rate is rapid 1 10-1 20 and his Fitbit has a little palpitation at nighttime but no exercise intolerance shortness of breath chest pain palpitations syncope or TIA.  After discussion we decided to pursue cardioversion his Covid test was normal for him and we will plan on doing cardioversion 3 weeks after initiating Eliquis and starting a low-dose beta-blocker for rate control.  He will need to have a COVID-19 test done the week before his cardioversion.  He has no contraindication to antiplatelet anticoagulant therapy  Cardiac CTA 09/05/2018: IMPRESSION: 1. Coronary calcium score of 0. This was 0  percentile for age and sex matched control. 2. Normal coronary origin with right dominance. 3. No evidence of CAD. 4. Mildly dilated pulmonary artery measuring 34 mm suggestive of pulmonary hypertension   Echo 10/24/2018:   - Left ventricle: The cavity size was normal. Wall thickness was   increased in a pattern of moderate LVH. Systolic function was   normal. The estimated ejection fraction was in the range of 60%   to 65%. Wall motion was normal; there were no regional wall   motion abnormalities. Doppler parameters are consistent with   abnormal left ventricular relaxation (grade 1 diastolic   dysfunction). - Aortic valve: There was mild regurgitation. -Right ventricle:  The cavity size was normal. Wall thickness was normal. Systolic function was normal. Tricuspid valve:   Structurally normal valve.    Doppler: Transvalvular velocity was within the normal range. There was no regurgitation.-  Past Medical History:  Diagnosis Date  . Abnormal glucose 01/27/2016  . 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 Hyperlipidemia 01/27/2016  . Hypertension, essential 08/01/2018  . Localized swelling of both lower legs 08/01/2018  . 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 Obesity, unspecified 02/02/2016  . OSA (obstructive sleep apnea) 01/27/2016   PSG 06/01/16 AHI 11.7  . 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 KNEE ARTHROSCOPY    . VASECTOMY  1992    Current Medications: Current Meds  Medication Sig  . Cholecalciferol (VITAMIN D PO) Take 1 tablet by mouth  daily.  . Coenzyme Q10 (CO Q 10 PO) Take 1 tablet by mouth daily.  . Cyanocobalamin (VITAMIN B-12 PO) Take 1 tablet by mouth daily.  . hydrochlorothiazide (HYDRODIURIL) 25 MG tablet Take 25 mg by mouth daily.  Marland Kitchen lisinopril (PRINIVIL,ZESTRIL) 20 MG tablet TAKE ONE TABLET BY MOUTH EVERY DAY  . metFORMIN (GLUCOPHAGE-XR) 500 MG 24 hr tablet Take 500 mg by mouth daily with breakfast.  .  Multiple Vitamins-Minerals (ZINC PO) Take 1 tablet by mouth daily.  . Nutritional Supplements (DHEA PO) Take 1 tablet by mouth daily.  . Omega-3 Fatty Acids (FISH OIL) 1000 MG CAPS Take 1 capsule by mouth daily.   . tadalafil (CIALIS) 5 MG tablet Take 1 tablet by mouth daily.  Marland Kitchen VITAMIN K PO Take 1 tablet by mouth daily.     Allergies:   Patient has no known allergies.   Social History   Socioeconomic History  . Marital status: Legally Separated    Spouse name: Not on file  . Number of children: Not on file  . Years of education: Not on file  . Highest education level: Not on file  Occupational History  . Not on file  Tobacco Use  . Smoking status: Never Smoker  . Smokeless tobacco: Never Used  Substance and Sexual Activity  . Alcohol use: Yes    Comment: 4 drinks per week  . Drug use: Never  . Sexual activity: Not on file  Other Topics Concern  . Not on file  Social History Narrative  . Not on file   Social Determinants of Health   Financial Resource Strain:   . Difficulty of Paying Living Expenses: Not on file  Food Insecurity:   . Worried About Programme researcher, broadcasting/film/video in the Last Year: Not on file  . Ran Out of Food in the Last Year: Not on file  Transportation Needs:   . Lack of Transportation (Medical): Not on file  . Lack of Transportation (Non-Medical): Not on file  Physical Activity:   . Days of Exercise per Week: Not on file  . Minutes of Exercise per Session: Not on file  Stress:   . Feeling of Stress : Not on file  Social Connections:   . Frequency of Communication with Friends and Family: Not on file  . Frequency of Social Gatherings with Friends and Family: Not on file  . Attends Religious Services: Not on file  . Active Member of Clubs or Organizations: Not on file  . Attends Banker Meetings: Not on file  . Marital Status: Not on file     Family History: The patient's family history includes Alzheimer's disease in his father; Breast  cancer in his mother; Diabetes in his father and mother; Hypertension in his mother; Stroke in his father. ROS:   Please see the history of present illness.    All other systems reviewed and are negative.  EKGs/Labs/Other Studies Reviewed:    The following studies were reviewed today:  EKG:  EKG ordered today and personally reviewed.  The ekg ordered today demonstrates atrial fibrillation rapid ventricular response 112 bpm  Recent Labs: No results found for requested labs within last 8760 hours.  Recent Lipid Panel No results found for: CHOL, TRIG, HDL, CHOLHDL, VLDL, LDLCALC, LDLDIRECT  Physical Exam:    VS:  BP 124/76   Pulse 81   Ht 6\' 3"  (1.905 m)   Wt 266 lb 12.8 oz (121 kg)   SpO2 98%   BMI 33.35  kg/m     Wt Readings from Last 3 Encounters:  01/01/20 266 lb 12.8 oz (121 kg)  12/12/19 258 lb (117 kg)  08/08/18 266 lb (120.7 kg)     GEN:  Well nourished, well developed in no acute distress HEENT: Normal NECK: No JVD; No carotid bruits LYMPHATICS: No lymphadenopathy CARDIAC: Irregular S1 is variable RRR, no murmurs, rubs, gallops RESPIRATORY:  Clear to auscultation without rales, wheezing or rhonchi  ABDOMEN: Soft, non-tender, non-distended MUSCULOSKELETAL:  No edema; No deformity  SKIN: Warm and dry NEUROLOGIC:  Alert and oriented x 3 PSYCHIATRIC:  Normal affect    Signed, Norman Herrlich, MD  01/01/2020 10:12 AM    [facility]

## 2020-01-01 ENCOUNTER — Other Ambulatory Visit: Payer: Self-pay

## 2020-01-01 ENCOUNTER — Encounter: Payer: Self-pay | Admitting: Cardiology

## 2020-01-01 ENCOUNTER — Ambulatory Visit (INDEPENDENT_AMBULATORY_CARE_PROVIDER_SITE_OTHER): Payer: BC Managed Care – PPO | Admitting: Cardiology

## 2020-01-01 VITALS — BP 124/76 | HR 81 | Ht 75.0 in | Wt 266.8 lb

## 2020-01-01 DIAGNOSIS — I4891 Unspecified atrial fibrillation: Secondary | ICD-10-CM

## 2020-01-01 DIAGNOSIS — I482 Chronic atrial fibrillation, unspecified: Secondary | ICD-10-CM | POA: Insufficient documentation

## 2020-01-01 MED ORDER — APIXABAN 5 MG PO TABS: 5.0000 mg | ORAL_TABLET | Freq: Two times a day (BID) | ORAL | 4 refills | Status: DC

## 2020-01-01 MED ORDER — METOPROLOL SUCCINATE ER 25 MG PO TB24: 25.0000 mg | ORAL_TABLET | Freq: Every day | ORAL | 3 refills | Status: DC

## 2020-01-01 NOTE — Patient Instructions (Addendum)
Medication Instructions:  1) START Eliquis 5 mg twice a day   2) START Metoprolol (Toprolol XL) 25 mg once a day   *If you need a refill on your cardiac medications before your next appointment, please call your pharmacy*  Lab Work: TSH, CBC & BMP   If you have labs (blood work) drawn today and your tests are completely normal, you will receive your results only by: Marland Kitchen MyChart Message (if you have MyChart) OR . A paper copy in the mail If you have any lab test that is abnormal or we need to change your treatment, we will call you to review the results.  Testing/Procedures: Cardioversion instructions below    Please go to the [facility] in [place] to have your covid test done 1 week prior to your cardioversion    Follow-Up: At [facility], you and your health needs are our priority.  As part of our continuing mission to provide you with exceptional heart care, we have created designated Provider Care Teams.  These Care Teams include your primary Cardiologist (physician) and Advanced Practice Providers (APPs -  Physician Assistants and Nurse Practitioners) who all work together to provide you with the care you need, when you need it.  Your next appointment:   6 week(s)  The format for your next appointment:   In Person  Provider:   Norman Herrlich, MD  Other Instructions You are scheduled for a  Cardioversion on 01/25/2020 with Dr. Dulce Sellar at 7:00 AM   DIET: Nothing to eat or drink after midnight except a sip of water with medications (see medication instructions below)  Medication Instructions: Hold Hydrochlorothiazide   Continue your anticoagulant: ELIQUIS You will need to continue your anticoagulant after your procedure until you are told by your  Provider that it is safe to stop   You must have a responsible person to drive you home and stay in the waiting area during your procedure. Failure to do so could result in cancellation.  Bring your insurance cards.  *Special  Note: Every effort is made to have your procedure done on time. Occasionally there are emergencies that occur at the hospital that may cause delays. Please be patient if a delay does occur.

## 2020-01-18 ENCOUNTER — Other Ambulatory Visit: Payer: Self-pay | Admitting: *Deleted

## 2020-01-18 ENCOUNTER — Telehealth: Payer: Self-pay | Admitting: Cardiology

## 2020-01-18 DIAGNOSIS — Z01812 Encounter for preprocedural laboratory examination: Secondary | ICD-10-CM

## 2020-01-18 DIAGNOSIS — I10 Essential (primary) hypertension: Secondary | ICD-10-CM

## 2020-01-18 NOTE — Telephone Encounter (Signed)
Pt needs covid screen and labs prior to cardioversion on Feb 19,2021 at [facility]. [facility] can not swab for Covid. Made appointment at the [facility] for Covid screen and labs entered for him to come MOnday to be drawn at the office.

## 2020-01-18 NOTE — Telephone Encounter (Signed)
Patient returned a call from Vernona Rieger about lab results. Pt needs to have blood work done and a covid test a week before his upcoming procedure, and [facility] does not do covid tests. He really needs to speak with someone from our office today

## 2020-01-18 NOTE — Telephone Encounter (Signed)
New Message    Shanda Bumps is calling from [facility] and states she cannot swab for the Covid virus. She says she will need new orders to be sent     Please call the pt when new orders are sent so he can go do his labs. Pt states he needs to have them done today

## 2020-01-18 NOTE — Telephone Encounter (Signed)
Left message to return call 

## 2020-01-21 ENCOUNTER — Other Ambulatory Visit (HOSPITAL_COMMUNITY)
Admission: RE | Admit: 2020-01-21 | Discharge: 2020-01-21 | Disposition: A | Discharge status: Home / Self Care | Payer: BC Managed Care – PPO | Source: Ambulatory Visit | Attending: Cardiology | Admitting: Cardiology

## 2020-01-21 DIAGNOSIS — Z01812 Encounter for preprocedural laboratory examination: Secondary | ICD-10-CM | POA: Insufficient documentation

## 2020-01-21 DIAGNOSIS — Z20822 Contact with and (suspected) exposure to covid-19: Secondary | ICD-10-CM | POA: Insufficient documentation

## 2020-01-21 LAB — SARS CORONAVIRUS 2 (TAT 6-24 HRS): SARS Coronavirus 2: NEGATIVE

## 2020-01-22 LAB — CBC
Hematocrit: 49.6 % (ref 37.5–51.0)
Hemoglobin: 17.9 g/dL — ABNORMAL HIGH (ref 13.0–17.7)
MCH: 32.4 pg (ref 26.6–33.0)
MCHC: 36.1 g/dL — ABNORMAL HIGH (ref 31.5–35.7)
MCV: 90 fL (ref 79–97)
Platelets: 144 10*3/uL — ABNORMAL LOW (ref 150–450)
RBC: 5.53 x10E6/uL (ref 4.14–5.80)
RDW: 12.4 % (ref 11.6–15.4)
WBC: 6.5 10*3/uL (ref 3.4–10.8)

## 2020-01-22 LAB — BASIC METABOLIC PANEL
BUN/Creatinine Ratio: 15 (ref 10–24)
BUN: 21 mg/dL (ref 8–27)
CO2: 21 mmol/L (ref 20–29)
Calcium: 8.9 mg/dL (ref 8.6–10.2)
Chloride: 103 mmol/L (ref 96–106)
Creatinine, Ser: 1.39 mg/dL — ABNORMAL HIGH (ref 0.76–1.27)
GFR calc Af Amer: 62 mL/min/{1.73_m2} (ref >59)
GFR calc non Af Amer: 54 mL/min/{1.73_m2} — ABNORMAL LOW (ref >59)
Glucose: 124 mg/dL — ABNORMAL HIGH (ref 65–99)
Potassium: 4.4 mmol/L (ref 3.5–5.2)
Sodium: 142 mmol/L (ref 134–144)

## 2020-01-24 ENCOUNTER — Telehealth: Payer: Self-pay | Admitting: Cardiology

## 2020-01-24 NOTE — Telephone Encounter (Signed)
Unable to reach [place], no answer

## 2020-01-24 NOTE — Telephone Encounter (Signed)
Have tried calling twice, no answer. Will continue to try to call

## 2020-01-24 NOTE — Telephone Encounter (Signed)
New Message   Brandi from [facility] would like for Dr Thersa Salt nurse to call her in regards to a cardioversion for this patient    Please call

## 2020-01-25 ENCOUNTER — Telehealth: Payer: Self-pay | Admitting: *Deleted

## 2020-01-25 ENCOUNTER — Encounter: Payer: Self-pay | Admitting: *Deleted

## 2020-01-25 DIAGNOSIS — I4891 Unspecified atrial fibrillation: Secondary | ICD-10-CM | POA: Insufficient documentation

## 2020-01-25 DIAGNOSIS — I4819 Other persistent atrial fibrillation: Secondary | ICD-10-CM | POA: Insufficient documentation

## 2020-01-25 HISTORY — DX: Unspecified atrial fibrillation: I48.91

## 2020-01-25 HISTORY — DX: Other persistent atrial fibrillation: I48.19

## 2020-01-25 NOTE — Telephone Encounter (Signed)
Spoke with patient. He wants to schedule his cardioversion for after his vacation. He will be back 02/11/20. Rescheduled COVID screen for 02/12/20 and will fax results to [facility] when resulted. Tentatively rescheduled cardioversion on 02/15/20 per patient request.

## 2020-02-12 ENCOUNTER — Other Ambulatory Visit (HOSPITAL_COMMUNITY)
Admission: RE | Admit: 2020-02-12 | Discharge: 2020-02-12 | Disposition: A | Discharge status: Home / Self Care | Payer: BC Managed Care – PPO | Source: Ambulatory Visit | Attending: Cardiology | Admitting: Cardiology

## 2020-02-12 DIAGNOSIS — Z20822 Contact with and (suspected) exposure to covid-19: Secondary | ICD-10-CM | POA: Insufficient documentation

## 2020-02-12 DIAGNOSIS — Z01812 Encounter for preprocedural laboratory examination: Secondary | ICD-10-CM | POA: Insufficient documentation

## 2020-02-12 LAB — SARS CORONAVIRUS 2 (TAT 6-24 HRS): SARS Coronavirus 2: NEGATIVE

## 2020-02-13 ENCOUNTER — Telehealth: Payer: Self-pay | Admitting: Cardiology

## 2020-02-13 NOTE — Telephone Encounter (Signed)
Spoke with [facility] hospital Same day surgery . They state Dr Servando Salina who will de doing the DCCV on 02/15/20 can write an updated H and P on the day of cardioversion. Spoke with Dr Servando Salina and made aware this will need to be done prior to proceding.

## 2020-02-13 NOTE — Telephone Encounter (Signed)
Pt is scheduled for a Cardioversion at [facility] on Friday 03-12. The Hospital does not have updated paperwork. Updated paperwork is needed by tomorrow otherwise the Hospital will have to reschedule the cardioversion for another day.   Please fax updated paperwork to [phone]  [facility] also needs the patient to bring a copy of his COVID test results with him to his Cardioversion

## 2020-02-15 ENCOUNTER — Other Ambulatory Visit: Payer: Self-pay | Admitting: Cardiology

## 2020-02-15 DIAGNOSIS — I4891 Unspecified atrial fibrillation: Secondary | ICD-10-CM

## 2020-02-15 MED ORDER — FLECAINIDE ACETATE 50 MG PO TABS: 50.0000 mg | ORAL_TABLET | Freq: Two times a day (BID) | ORAL | 3 refills | Status: DC

## 2020-02-20 ENCOUNTER — Other Ambulatory Visit: Payer: Self-pay

## 2020-02-20 DIAGNOSIS — R0602 Shortness of breath: Secondary | ICD-10-CM

## 2020-02-20 DIAGNOSIS — I10 Essential (primary) hypertension: Secondary | ICD-10-CM

## 2020-02-20 MED ORDER — FUROSEMIDE 20 MG PO TABS: 20.0000 mg | ORAL_TABLET | Freq: Every day | ORAL | 3 refills | Status: DC

## 2020-02-22 ENCOUNTER — Other Ambulatory Visit: Payer: Self-pay | Admitting: *Deleted

## 2020-02-22 DIAGNOSIS — I10 Essential (primary) hypertension: Secondary | ICD-10-CM

## 2020-02-23 LAB — PRO B NATRIURETIC PEPTIDE: NT-Pro BNP: 724 pg/mL — ABNORMAL HIGH (ref 0–210)

## 2020-02-24 NOTE — Progress Notes (Signed)
Cardiology Office Note:    Date:  02/25/2020   ID:  George Hatfield, DOB 05-Dec-1956, MRN [id number]  PCP:  Everlean Cherry, MD  Cardiologist:  Norman Herrlich, MD    Referring MD: Everlean Cherry, MD    ASSESSMENT:    1. Paroxysmal atrial fibrillation (HCC)   2. Hypertension, essential   3. Chronic anticoagulation    PLAN:    In order of problems listed above:  1. Is back in atrial fibrillation discontinue flecainide will become active over the next 6 weeks and make a decision if he wants to pursue EP radiofrequency ablation. 2. Any beta-blocker or ACE inhibitor anticoagulant 3. Echo cardiogram with edema shortness of breath regarding tachycardia induced cardiomyopathy recheck renal function potassium today and he will take his furosemide on a as needed basis.   Next appointment: Next 6 weeks   Medication Adjustments/Labs and Tests Ordered: Current medicines are reviewed at length with the patient today.  Concerns regarding medicines are outlined above.  No orders of the defined types were placed in this encounter.  No orders of the defined types were placed in this encounter.   Chief Complaint  Patient presents with  . Follow-up    Cardioversion for  . Atrial Fibrillation    History of Present Illness:    George Hatfield is a 64 y.o. male with a hx of paroxysmal atrial fibrillation and recent cardioversion last seen by me 01/01/2020.  Coronary calcium score of 0 in 2019 echocardiogram 10/24/2018 showed ejection fraction of 60 to 65%.  He contacted me last week with symptoms suggestive of heart failure was placed on a diuretic and an echocardiogram was ordered.. Compliance with diet, lifestyle and medications: Yes   Solid diuretic was discontinued..  1 hour he was examined short of breath took a couple doses of furosemide.  Since that time renal edema shortness of breath chest pain palpitation or syncope.  At the time he was short of breath he had a productive cough but  hemoptysis.  His Apple Watch suggest atrial fibrillation we will check an EKG in the office and since he took diuretic we will check his potassium today.  He is scheduled for outpatient echocardiogram Past Medical History:  Diagnosis Date  . Abnormal glucose 01/27/2016  . Atrial fibrillation (HCC) 01/25/2020  . 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 Hyperlipidemia 01/27/2016  . Hypertension, essential 08/01/2018  . Localized swelling of both lower legs 08/01/2018  . 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 Obesity, unspecified 02/02/2016  . OSA (obstructive sleep apnea) 01/27/2016   PSG 06/01/16 AHI 11.7  . SOB (shortness of breath) 08/08/2018  . 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 KNEE ARTHROSCOPY    . VASECTOMY  1992    Current Medications: Current Meds  Medication Sig  . apixaban (ELIQUIS) 5 MG TABS tablet Take 1 tablet (5 mg total) by mouth 2 (two) times daily.  . Cholecalciferol (VITAMIN D PO) Take 1 tablet by mouth daily.  . Coenzyme Q10 (CO Q 10 PO) Take 1 tablet by mouth daily.  . Cyanocobalamin (VITAMIN B-12 PO) Take 1 tablet by mouth daily.  . flecainide (TAMBOCOR) 50 MG tablet Take 1 tablet (50 mg total) by mouth 2 (two) times daily.  . furosemide (LASIX) 20 MG tablet Take 1 tablet (20 mg total) by mouth daily.  Marland Kitchen lisinopril (PRINIVIL,ZESTRIL) 20 MG tablet TAKE ONE TABLET BY MOUTH EVERY DAY  . metFORMIN (GLUCOPHAGE-XR) 500 MG  24 hr tablet Take 500 mg by mouth daily with breakfast.  . metoprolol succinate (TOPROL XL) 25 MG 24 hr tablet Take 1 tablet (25 mg total) by mouth daily.  . Multiple Vitamins-Minerals (ZINC PO) Take 1 tablet by mouth daily.  . Nutritional Supplements (DHEA PO) Take 1 tablet by mouth daily.  . Omega-3 Fatty Acids (FISH OIL) 1000 MG CAPS Take 1 capsule by mouth daily.   . tadalafil (CIALIS) 5 MG tablet Take 1 tablet by mouth daily.  Marland Kitchen VITAMIN K PO Take 1 tablet by mouth daily.     Allergies:   Patient  has no known allergies.   Social History   Socioeconomic History  . Marital status: Legally Separated    Spouse name: Not on file  . Number of children: Not on file  . Years of education: Not on file  . Highest education level: Not on file  Occupational History  . Not on file  Tobacco Use  . Smoking status: Never Smoker  . Smokeless tobacco: Never Used  Substance and Sexual Activity  . Alcohol use: Yes    Comment: 4 drinks per week  . Drug use: Never  . Sexual activity: Not on file  Other Topics Concern  . Not on file  Social History Narrative  . Not on file   Social Determinants of Health   Financial Resource Strain:   . Difficulty of Paying Living Expenses:   Food Insecurity:   . Worried About Programme researcher, broadcasting/film/video in the Last Year:   . Barista in the Last Year:   Transportation Needs:   . Freight forwarder (Medical):   Marland Kitchen Lack of Transportation (Non-Medical):   Physical Activity:   . Days of Exercise per Week:   . Minutes of Exercise per Session:   Stress:   . Feeling of Stress :   Social Connections:   . Frequency of Communication with Friends and Family:   . Frequency of Social Gatherings with Friends and Family:   . Attends Religious Services:   . Active Member of Clubs or Organizations:   . Attends Banker Meetings:   Marland Kitchen Marital Status:      Family History: The patient's family history includes Alzheimer's disease in his father; Breast cancer in his mother; Diabetes in his father and mother; Hypertension in his mother; Stroke in his father. ROS:   Please see the history of present illness.    All other systems reviewed and are negative.  EKGs/Labs/Other Studies Reviewed:    The following studies were reviewed today:  EKG:  EKG ordered today and personally reviewed.  The ekg ordered today demonstrates  Atrial fibrillation control heart rate range Recent Labs: 01/21/2020: BUN 21; Creatinine, Ser 1.39; Hemoglobin 17.9; Platelets  144; Potassium 4.4; Sodium 142 02/22/2020: NT-Pro BNP 724  Recent Lipid Panel No results found for: CHOL, TRIG, HDL, CHOLHDL, VLDL, LDLCALC, LDLDIRECT  Physical Exam:    VS:  BP (!) 146/92   Pulse 92   Ht 6\' 3"  (1.905 m)   Wt 263 lb (119.3 kg)   SpO2 96%   BMI 32.87 kg/m     Wt Readings from Last 3 Encounters:  02/25/20 263 lb (119.3 kg)  01/01/20 266 lb 12.8 oz (121 kg)  12/12/19 258 lb (117 kg)     GEN:  Well nourished, well developed in no acute distress HEENT: Normal NECK: No JVD; No carotid bruits LYMPHATICS: No lymphadenopathy CARDIAC: His rhythm seems regular to  me with extrasystoles as opposed to atrial fibrillation RRR, no murmurs, rubs, gallops RESPIRATORY:  Clear to auscultation without rales, wheezing or rhonchi  ABDOMEN: Soft, non-tender, non-distended MUSCULOSKELETAL:  No edema; No deformity  SKIN: Warm and dry NEUROLOGIC:  Alert and oriented x 3 PSYCHIATRIC:  Normal affect    Signed, Shirlee More, MD  02/25/2020 9:43 AM    [place]

## 2020-02-25 ENCOUNTER — Encounter: Payer: Self-pay | Admitting: Cardiology

## 2020-02-25 ENCOUNTER — Other Ambulatory Visit: Payer: Self-pay

## 2020-02-25 ENCOUNTER — Ambulatory Visit: Payer: BC Managed Care – PPO | Admitting: Cardiology

## 2020-02-25 VITALS — BP 146/92 | HR 92 | Ht 75.0 in | Wt 263.0 lb

## 2020-02-25 DIAGNOSIS — I10 Essential (primary) hypertension: Secondary | ICD-10-CM | POA: Diagnosis not present

## 2020-02-25 DIAGNOSIS — I480 Paroxysmal atrial fibrillation: Secondary | ICD-10-CM | POA: Diagnosis not present

## 2020-02-25 DIAGNOSIS — Z7901 Long term (current) use of anticoagulants: Secondary | ICD-10-CM

## 2020-02-25 LAB — BASIC METABOLIC PANEL
BUN/Creatinine Ratio: 14 (ref 10–24)
BUN: 14 mg/dL (ref 8–27)
CO2: 23 mmol/L (ref 20–29)
Calcium: 8.8 mg/dL (ref 8.6–10.2)
Chloride: 104 mmol/L (ref 96–106)
Creatinine, Ser: 0.97 mg/dL (ref 0.76–1.27)
GFR calc Af Amer: 96 mL/min/{1.73_m2} (ref >59)
GFR calc non Af Amer: 83 mL/min/{1.73_m2} (ref >59)
Glucose: 110 mg/dL — ABNORMAL HIGH (ref 65–99)
Potassium: 4.2 mmol/L (ref 3.5–5.2)
Sodium: 141 mmol/L (ref 134–144)

## 2020-02-25 NOTE — Patient Instructions (Signed)
Medication Instructions:  Your physician has recommended you make the following change in your medication:  1. STOP: Flecanide 50 mg.  *If you need a refill on your cardiac medications before your next appointment, please call your pharmacy*   Lab Work: Your physician recommends that you return for lab work in: TODAY BMP   If you have labs (blood work) drawn today and your tests are completely normal, you will receive your results only by: Marland Kitchen MyChart Message (if you have MyChart) OR . A paper copy in the mail If you have any lab test that is abnormal or we need to change your treatment, we will call you to review the results.   Testing/Procedures: None   Follow-Up: At [facility], you and your health needs are our priority.  As part of our continuing mission to provide you with exceptional heart care, we have created designated Provider Care Teams.  These Care Teams include your primary Cardiologist (physician) and Advanced Practice Providers (APPs -  Physician Assistants and Nurse Practitioners) who all work together to provide you with the care you need, when you need it.  We recommend signing up for the patient portal called "MyChart".  Sign up information is provided on this After Visit Summary.  MyChart is used to connect with patients for Virtual Visits (Telemedicine).  Patients are able to view lab/test results, encounter notes, upcoming appointments, etc.  Non-urgent messages can be sent to your provider as well.   To learn more about what you can do with MyChart, go to [email or web address].    Your next appointment:   6 week(s)  The format for your next appointment:   In person  Provider:   Dr Dulce Sellar   Other Instructions Look into the  Kardia watchband on [employer].

## 2020-02-25 NOTE — Addendum Note (Signed)
Addended by: Delorse Limber I on: 02/25/2020 10:01 AM   Modules accepted: Orders

## 2020-02-28 NOTE — Addendum Note (Signed)
Addended by: Lita Mains on: 02/28/2020 10:24 AM   Modules accepted: Orders

## 2020-03-04 ENCOUNTER — Encounter: Payer: Self-pay | Admitting: Cardiology

## 2020-03-04 DIAGNOSIS — I361 Nonrheumatic tricuspid (valve) insufficiency: Secondary | ICD-10-CM

## 2020-04-04 NOTE — Progress Notes (Signed)
Cardiology Office Note:    Date:  04/07/2020   ID:  George Hatfield, DOB 06-30-1956, MRN [id number]  PCP:  Everlean Cherry, MD  Cardiologist:  Norman Herrlich, MD    Referring MD: Everlean Cherry, MD    ASSESSMENT:    1. Paroxysmal atrial fibrillation (HCC)   2. Hypertension, essential   3. Chronic anticoagulation    PLAN:    In order of problems listed above:  1. He remains in atrial fibrillation his course has not been favorable he failed after cardioversion resuming sinus rhythm on flecainide and had symptoms suggestive of heart failure that cleared with a few doses of diuretic.  EF is low normal and I think he would do best long-term staying in sinus rhythm will refer to EP for consideration of pulmonary vein isolation and a loop recorder to guide if he needs long-term anticoagulation. 2. Stable continue thiazide diuretic ARB 3. Continue anticoagulation no bleeding complication   Next appointment: 3 months   Medication Adjustments/Labs and Tests Ordered: Current medicines are reviewed at length with the patient today.  Concerns regarding medicines are outlined above.  No orders of the defined types were placed in this encounter.  No orders of the defined types were placed in this encounter.   Chief Complaint  Patient presents with  . Follow-up  . Congestive Heart Failure    After cardioversion  . Atrial Fibrillation    History of Present Illness:    George Hatfield is a 64 y.o. male with a hx of paroxysmal atrial fibrillation with cardioversion and recurrence despite an antiarrhythmic drug flecainide.  He was last seen 02/26/2020 rate controlled atrial fibrillation was continued on his anticoagulant and echocardiogram ordered as he had developed edema and shortness of breath requiring a diuretic after cardioversion.  His proBNP level was elevated moderately at 724. Compliance with diet, lifestyle and medications: Yes  Is improved but we have atrial fibrillation back to  activities like cycling he finds he short of breath with things that he did easily passed he has not had any findings of heart failure since he took a few doses of a loop diuretic although I did not have the report the echocardiogram done at [facility] 03/04/2020 which showed EF of 50 to 55% low normal and severe left atrial enlargement.  He is quite interested in resuming maintaining sinus rhythm and potentially long-term coming off anticoagulation after discussion will be sent for EP consultation regarding PVI with or without an implanted loop recorder afterwards to guide the need for ongoing anticoagulation. Past Medical History:  Diagnosis Date  . Abnormal glucose 01/27/2016  . Atrial fibrillation (HCC) 01/25/2020  . 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 Hyperlipidemia 01/27/2016  . Hypertension, essential 08/01/2018  . Localized swelling of both lower legs 08/01/2018  . 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 Obesity, unspecified 02/02/2016  . OSA (obstructive sleep apnea) 01/27/2016   PSG 06/01/16 AHI 11.7  . SOB (shortness of breath) 08/08/2018  . 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 KNEE ARTHROSCOPY    . VASECTOMY  1992    Current Medications: Current Meds  Medication Sig  . apixaban (ELIQUIS) 5 MG TABS tablet Take 1 tablet (5 mg total) by mouth 2 (two) times daily.  . Cholecalciferol (VITAMIN D PO) Take 1 tablet by mouth daily.  . Coenzyme Q10 (CO Q 10 PO) Take 1 tablet by mouth daily.  . Cyanocobalamin (VITAMIN B-12 PO) Take  1 tablet by mouth daily.  . hydrochlorothiazide (HYDRODIURIL) 25 MG tablet Take 25 mg by mouth daily.  Marland Kitchen lisinopril (PRINIVIL,ZESTRIL) 20 MG tablet TAKE ONE TABLET BY MOUTH EVERY DAY  . metFORMIN (GLUCOPHAGE-XR) 500 MG 24 hr tablet Take 500 mg by mouth daily with breakfast.  . metoprolol succinate (TOPROL XL) 25 MG 24 hr tablet Take 1 tablet (25 mg total) by mouth daily.  . Nutritional Supplements (DHEA  PO) Take 1 tablet by mouth daily.  . Omega-3 Fatty Acids (FISH OIL) 1000 MG CAPS Take 1 capsule by mouth daily.   . SOOLANTRA 1 % CREA Apply 1 application topically at bedtime.  . tadalafil (CIALIS) 5 MG tablet Take 1 tablet by mouth daily.  Marland Kitchen VITAMIN K PO Take 1 tablet by mouth daily.     Allergies:   Patient has no known allergies.   Social History   Socioeconomic History  . Marital status: Legally Separated    Spouse name: Not on file  . Number of children: Not on file  . Years of education: Not on file  . Highest education level: Not on file  Occupational History  . Not on file  Tobacco Use  . Smoking status: Never Smoker  . Smokeless tobacco: Never Used  Substance and Sexual Activity  . Alcohol use: Yes    Comment: 4 drinks per week  . Drug use: Never  . Sexual activity: Not on file  Other Topics Concern  . Not on file  Social History Narrative  . Not on file   Social Determinants of Health   Financial Resource Strain:   . Difficulty of Paying Living Expenses:   Food Insecurity:   . Worried About Charity fundraiser in the Last Year:   . Arboriculturist in the Last Year:   Transportation Needs:   . Film/video editor (Medical):   Marland Kitchen Lack of Transportation (Non-Medical):   Physical Activity:   . Days of Exercise per Week:   . Minutes of Exercise per Session:   Stress:   . Feeling of Stress :   Social Connections:   . Frequency of Communication with Friends and Family:   . Frequency of Social Gatherings with Friends and Family:   . Attends Religious Services:   . Active Member of Clubs or Organizations:   . Attends Archivist Meetings:   Marland Kitchen Marital Status:      Family History: The patient's family history includes Alzheimer's disease in his father; Breast cancer in his mother; Diabetes in his father and mother; Hypertension in his mother; Stroke in his father. ROS:   Please see the history of present illness.    All other systems reviewed and  are negative.  EKGs/Labs/Other Studies Reviewed:    The following studies were reviewed today:  EKG: I did not repeat an EKG today  Recent Labs: 01/21/2020: Hemoglobin 17.9; Platelets 144 02/22/2020: NT-Pro BNP 724 02/25/2020: BUN 14; Creatinine, Ser 0.97; Potassium 4.2; Sodium 141  Recent Lipid Panel No results found for: CHOL, TRIG, HDL, CHOLHDL, VLDL, LDLCALC, LDLDIRECT  Physical Exam:    VS:  BP (!) 144/82   Pulse 80   Temp (!) 97 F (36.1 C)   Ht 6\' 3"  (1.905 m)   Wt 259 lb (117.5 kg)   SpO2 96%   BMI 32.37 kg/m     Wt Readings from Last 3 Encounters:  04/07/20 259 lb (117.5 kg)  02/25/20 263 lb (119.3 kg)  01/01/20 266 lb  12.8 oz (121 kg)     GEN:  Well nourished, well developed in no acute distress HEENT: Normal NECK: No JVD; No carotid bruits LYMPHATICS: No lymphadenopathy CARDIAC: Irregular S1 variableno murmurs, rubs, gallops RESPIRATORY:  Clear to auscultation without rales, wheezing or rhonchi  ABDOMEN: Soft, non-tender, non-distended MUSCULOSKELETAL:  No edema; No deformity  SKIN: Warm and dry NEUROLOGIC:  Alert and oriented x 3 PSYCHIATRIC:  Normal affect    Signed, Norman Herrlich, MD  04/07/2020 9:24 AM    [facility]

## 2020-04-07 ENCOUNTER — Other Ambulatory Visit: Payer: Self-pay

## 2020-04-07 ENCOUNTER — Telehealth: Payer: Self-pay

## 2020-04-07 ENCOUNTER — Encounter: Payer: Self-pay | Admitting: Cardiology

## 2020-04-07 ENCOUNTER — Ambulatory Visit: Payer: BC Managed Care – PPO | Admitting: Cardiology

## 2020-04-07 VITALS — BP 144/82 | HR 80 | Temp 97.0°F | Ht 75.0 in | Wt 259.0 lb

## 2020-04-07 DIAGNOSIS — I480 Paroxysmal atrial fibrillation: Secondary | ICD-10-CM

## 2020-04-07 DIAGNOSIS — Z7901 Long term (current) use of anticoagulants: Secondary | ICD-10-CM

## 2020-04-07 DIAGNOSIS — I10 Essential (primary) hypertension: Secondary | ICD-10-CM

## 2020-04-07 NOTE — Addendum Note (Signed)
Addended by: Delorse Limber I on: 04/07/2020 09:33 AM   Modules accepted: Orders

## 2020-04-07 NOTE — Telephone Encounter (Signed)
This patient needs to be seen by Dr. Elberta Fortis. I am routing this to the [address] scheduling pool to get this patient scheduled ASAP.

## 2020-04-07 NOTE — Patient Instructions (Addendum)
Medication Instructions:  Your physician recommends that you continue on your current medications as directed. Please refer to the Current Medication list given to you today.  *If you need a refill on your cardiac medications before your next appointment, please call your pharmacy*   Lab Work: None If you have labs (blood work) drawn today and your tests are completely normal, you will receive your results only by: Marland Kitchen MyChart Message (if you have MyChart) OR . A paper copy in the mail If you have any lab test that is abnormal or we need to change your treatment, we will call you to review the results.   Testing/Procedures: None   Follow-Up: At [facility], you and your health needs are our priority.  As part of our continuing mission to provide you with exceptional heart care, we have created designated Provider Care Teams.  These Care Teams include your primary Cardiologist (physician) and Advanced Practice Providers (APPs -  Physician Assistants and Nurse Practitioners) who all work together to provide you with the care you need, when you need it.  We recommend signing up for the patient portal called "MyChart".  Sign up information is provided on this After Visit Summary.  MyChart is used to connect with patients for Virtual Visits (Telemedicine).  Patients are able to view lab/test results, encounter notes, upcoming appointments, etc.  Non-urgent messages can be sent to your provider as well.   To learn more about what you can do with MyChart, go to [email or web address].    Your next appointment:   3 month(s)  The format for your next appointment:   In Person  Provider:   Norman Herrlich, MD   Other Instructions We have put in a referral for you to see Dr. Elberta Fortis in [place]. They will call you to schedule this appointment.

## 2020-04-09 ENCOUNTER — Encounter: Payer: Self-pay | Admitting: Internal Medicine

## 2020-04-09 ENCOUNTER — Telehealth: Payer: Self-pay

## 2020-04-09 ENCOUNTER — Telehealth (INDEPENDENT_AMBULATORY_CARE_PROVIDER_SITE_OTHER): Payer: BC Managed Care – PPO | Admitting: Internal Medicine

## 2020-04-09 DIAGNOSIS — I4819 Other persistent atrial fibrillation: Secondary | ICD-10-CM

## 2020-04-09 DIAGNOSIS — G4733 Obstructive sleep apnea (adult) (pediatric): Secondary | ICD-10-CM | POA: Diagnosis not present

## 2020-04-09 DIAGNOSIS — I4891 Unspecified atrial fibrillation: Secondary | ICD-10-CM

## 2020-04-09 DIAGNOSIS — D6869 Other thrombophilia: Secondary | ICD-10-CM | POA: Diagnosis not present

## 2020-04-09 DIAGNOSIS — I4811 Longstanding persistent atrial fibrillation: Secondary | ICD-10-CM

## 2020-04-09 DIAGNOSIS — I10 Essential (primary) hypertension: Secondary | ICD-10-CM

## 2020-04-09 DIAGNOSIS — Z01810 Encounter for preprocedural cardiovascular examination: Secondary | ICD-10-CM

## 2020-04-09 NOTE — Progress Notes (Signed)
Electrophysiology TeleHealth Note   Due to national recommendations of social distancing due to [place] 19, Audio/video telehealth visit is felt to be most appropriate for this patient at this time.  See MyChart message from today for patient consent regarding telehealth for George Hatfield.   Date:  04/09/2020   ID:  George Hatfield, DOB [birth year], MRN [id number]  Location: home Provider location: [facility] Evaluation Performed: New patient consult  PCP:  Maris Berger, MD  Cardiologist:  Dr Bettina Gavia Electrophysiologist:  None   Chief Complaint:  afib  History of Present Illness:    George Hatfield is a 64 y.o. male who presents via audio/video conferencing for a telehealth visit today.   The patient is referred for new consultation regarding afib by Dr Bettina Gavia.  The patient reports initially being diagnosed with afib.  He reports being diagnosed with AF by his apple watch in December.  In retrospect, he thinks that he has had afib since at least 09/2019.  He reports symptoms of palpitations and fatigue.  He has had some SOB and CHF symptoms.  He continues to have reduced.  + edema  He was evaluated by Dr Bettina Gavia and placed on eliquis and subsequently metoprolol and flecainide.  He failed medical therapy with flecainide.  He underwent cardioversion 02/15/2020 but has since returned to afib.        He has OSA and uses CPAP.  He has reduced ETOH to 6 beers per week.                                                                                                                                                                          Today, he denies symptoms of palpitations, chest pain, shortness of breath, orthopnea, PND, dizziness, presyncope, syncope, bleeding, or neurologic sequela. The patient is tolerating medications without difficulties and is otherwise without complaint today.     Past Medical History:  Diagnosis Date  . Abnormal glucose 01/27/2016  . Erectile dysfunction of nonorganic  origin 02/02/2016  . Hyperlipidemia 01/27/2016  . Hypertension, essential 08/01/2018  . Localized swelling of both lower legs 08/01/2018  . 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 Obesity, unspecified 02/02/2016  . OSA (obstructive sleep apnea) 01/27/2016   PSG 06/01/16 AHI 11.7, uses CPAP  . Persistent atrial fibrillation ([place]) 01/25/2020  . Pre-diabetes   . 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 KNEE ARTHROSCOPY    . VASECTOMY  1992    Current Outpatient Medications  Medication Sig Dispense Refill  . apixaban (ELIQUIS) 5 MG TABS tablet Take 1 tablet (5 mg total) by mouth 2 (two) times daily. 60 tablet 4  . Cholecalciferol (VITAMIN D PO)  Take 1 tablet by mouth daily.    . Coenzyme Q10 (CO Q 10 PO) Take 1 tablet by mouth daily.    . Cyanocobalamin (VITAMIN B-12 PO) Take 1 tablet by mouth daily.    . hydrochlorothiazide (HYDRODIURIL) 25 MG tablet Take 25 mg by mouth daily.    Marland Kitchen lisinopril (PRINIVIL,ZESTRIL) 20 MG tablet TAKE ONE TABLET BY MOUTH EVERY DAY    . metFORMIN (GLUCOPHAGE-XR) 500 MG 24 hr tablet Take 500 mg by mouth daily with breakfast.    . metoprolol succinate (TOPROL XL) 25 MG 24 hr tablet Take 1 tablet (25 mg total) by mouth daily. 90 tablet 3  . Nutritional Supplements (DHEA PO) Take 1 tablet by mouth daily.    . Omega-3 Fatty Acids (FISH OIL) 1000 MG CAPS Take 1 capsule by mouth daily.     . SOOLANTRA 1 % CREA Apply 1 application topically at bedtime.    . tadalafil (CIALIS) 5 MG tablet Take 1 tablet by mouth daily.    Marland Kitchen VITAMIN K PO Take 1 tablet by mouth daily.     No current facility-administered medications for this visit.    Allergies:   Patient has no known allergies.   Social History:  The patient  reports that he has never smoked. He has never used smokeless tobacco. He reports current alcohol use. He reports that he does not use drugs.   Family History:  The patient's family history includes Alzheimer's disease in his  father; Breast cancer in his mother; Diabetes in his father and mother; Hypertension in his mother; Stroke in his father.    ROS:  Please see the history of present illness.   All other systems are personally reviewed and negative.    Exam:    Vital Signs:  There were no vitals taken for this visit.   Well appearing, alert and conversant, regular work of breathing,  good skin color Eyes- anicteric, neuro- grossly intact, skin- no apparent rash or lesions or cyanosis, mouth- oral mucosa is pink   Labs/Other Tests and Data Reviewed:    Recent Labs: 01/21/2020: Hemoglobin 17.9; Platelets 144 02/22/2020: NT-Pro BNP 724 02/25/2020: BUN 14; Creatinine, Ser 0.97; Potassium 4.2; Sodium 141   Wt Readings from Last 3 Encounters:  04/07/20 259 lb (117.5 kg)  02/25/20 263 lb (119.3 kg)  01/01/20 266 lb 12.8 oz (121 kg)     Other studies personally reviewed: Additional studies/ records that were reviewed today include: Dr Thersa Salt notes, prior echo, ekgs  Review of the above records today demonstrates: as above  Cardiac CT and echo 2019 reviewed  ASSESSMENT & PLAN:    1.  Persistent afib The patient has symptomatic, recurrent persistent atrial fibrillation. he has failed medical therapy with flecainide. Chads2vasc score is 1.  he is anticoagulated with eliquis . Therapeutic strategies for afib including medicine and ablation were discussed in detail with the patient today. Risk, benefits, and alternatives to EP study and radiofrequency ablation for afib were also discussed in detail today. These risks include but are not limited to stroke, bleeding, vascular damage, tamponade, perforation, damage to the esophagus, lungs, and other structures, pulmonary vein stenosis, worsening renal function, and death. The patient understands these risk and wishes to proceed.  We will therefore proceed with catheter ablation at the next available time.  Carto, ICE, anesthesia are requested for the procedure.   Will also obtain cardiac CT prior to the procedure to exclude LAA thrombus and further evaluate atrial anatomy.   2.  HTN Stable No change required today  3. Obesity I have reviewed the patients BMI and decreased success rates with ablation at length today.  Weight loss is strongly advised.  Per Guijian et al (PACE 2013; 36: 174-081), patients with BMI 25-29.9 (obese) have a 27% increase in AF recurrence post ablation.  Patients with BMI >30 have a 31% increase in AF recurrence post ablation when compared to those with BMI <25. We discussed lifestyle modification today  4. OSA He is compliant with CPAP  5. ETOH Avoidance encouraged  Patient Risk:  after full review of this patients clinical status, I feel that they are at moderate risk at this time.   Today, I have spent 20 minutes with the patient with telehealth technology discussing afib .    Signed, Hillis Range MD, [facility] 04/09/2020 9:45 AM   [facility] [address] [place] [address] [phone] (office) [phone] (fax)

## 2020-04-09 NOTE — Telephone Encounter (Signed)
May 29, 2020

## 2020-04-09 NOTE — Telephone Encounter (Signed)
-----   Message from Hillis Range, MD sent at 04/09/2020  9:53 AM EDT ----- He states that he had an echo at [place] in march.  Can you get this report for me?   Also, afib ablation [facility] prior

## 2020-04-10 ENCOUNTER — Other Ambulatory Visit: Payer: Self-pay

## 2020-04-11 NOTE — Telephone Encounter (Signed)
Pt agreed to Afib Ablation 05/29/20 at 7:30 am.  He is concerned with having to drive to [facility] for too many appts.. I spoke with Alonna Minium and she will pencil his CT in for 05/26/20 with his COVID test right after too minimize his travel. Pt to have labs done at [facility] 05/22/20.   Pt verbalized understanding of his instructions and will review in My Chart and will call back with any questions or concerns.

## 2020-04-17 NOTE — Addendum Note (Signed)
Addended by: Roney Mans A on: 04/17/2020 08:11 AM   Modules accepted: Orders

## 2020-05-16 ENCOUNTER — Other Ambulatory Visit: Payer: Self-pay

## 2020-05-16 ENCOUNTER — Other Ambulatory Visit: Payer: Self-pay | Admitting: Cardiology

## 2020-05-16 NOTE — Telephone Encounter (Signed)
Age 64, weight 117kg, SCr 0.97 on 02/25/20. Last OV May 2021, afib indication

## 2020-05-19 MED ORDER — APIXABAN 5 MG PO TABS: ORAL_TABLET | ORAL | 5 refills | Status: DC

## 2020-05-22 ENCOUNTER — Other Ambulatory Visit: Payer: Self-pay | Admitting: Emergency Medicine

## 2020-05-22 ENCOUNTER — Telehealth (HOSPITAL_COMMUNITY): Payer: Self-pay | Admitting: *Deleted

## 2020-05-22 DIAGNOSIS — Z01810 Encounter for preprocedural cardiovascular examination: Secondary | ICD-10-CM

## 2020-05-22 DIAGNOSIS — I4811 Longstanding persistent atrial fibrillation: Secondary | ICD-10-CM

## 2020-05-22 NOTE — Telephone Encounter (Signed)
Reaching out to patient to offer assistance regarding upcoming cardiac imaging study; pt verbalizes understanding of appt date/time, parking situation and where to check in, pre-test NPO status and medications ordered, and verified current allergies; name and call back number provided for further questions should they arise ° °Persais Ethridge Tai RN Navigator Cardiac Imaging °[facility] °[phone] office °[phone] cell ° °

## 2020-05-23 LAB — CBC WITH DIFFERENTIAL/PLATELET
Basophils Absolute: 0.1 10*3/uL (ref 0.0–0.2)
Basos: 1 %
EOS (ABSOLUTE): 0.2 10*3/uL (ref 0.0–0.4)
Eos: 3 %
Hematocrit: 51.3 % — ABNORMAL HIGH (ref 37.5–51.0)
Hemoglobin: 17.9 g/dL — ABNORMAL HIGH (ref 13.0–17.7)
Immature Grans (Abs): 0.1 10*3/uL (ref 0.0–0.1)
Immature Granulocytes: 1 %
Lymphocytes Absolute: 1.6 10*3/uL (ref 0.7–3.1)
Lymphs: 23 %
MCH: 33 pg (ref 26.6–33.0)
MCHC: 34.9 g/dL (ref 31.5–35.7)
MCV: 95 fL (ref 79–97)
Monocytes Absolute: 0.5 10*3/uL (ref 0.1–0.9)
Monocytes: 8 %
Neutrophils Absolute: 4.4 10*3/uL (ref 1.4–7.0)
Neutrophils: 64 %
Platelets: 206 10*3/uL (ref 150–450)
RBC: 5.42 x10E6/uL (ref 4.14–5.80)
RDW: 12.8 % (ref 11.6–15.4)
WBC: 6.7 10*3/uL (ref 3.4–10.8)

## 2020-05-23 LAB — BASIC METABOLIC PANEL
BUN/Creatinine Ratio: 12 (ref 10–24)
BUN: 13 mg/dL (ref 8–27)
CO2: 23 mmol/L (ref 20–29)
Calcium: 8.8 mg/dL (ref 8.6–10.2)
Chloride: 104 mmol/L (ref 96–106)
Creatinine, Ser: 1.08 mg/dL (ref 0.76–1.27)
GFR calc Af Amer: 84 mL/min/{1.73_m2} (ref >59)
GFR calc non Af Amer: 73 mL/min/{1.73_m2} (ref >59)
Glucose: 106 mg/dL — ABNORMAL HIGH (ref 65–99)
Potassium: 4.3 mmol/L (ref 3.5–5.2)
Sodium: 140 mmol/L (ref 134–144)

## 2020-05-26 ENCOUNTER — Other Ambulatory Visit: Payer: Self-pay

## 2020-05-26 ENCOUNTER — Ambulatory Visit (HOSPITAL_COMMUNITY)
Admission: RE | Admit: 2020-05-26 | Discharge: 2020-05-26 | Disposition: A | Discharge status: Home / Self Care | Payer: BC Managed Care – PPO | Source: Ambulatory Visit | Attending: Internal Medicine | Admitting: Internal Medicine

## 2020-05-26 ENCOUNTER — Other Ambulatory Visit (HOSPITAL_COMMUNITY)
Admission: RE | Admit: 2020-05-26 | Discharge: 2020-05-26 | Disposition: A | Discharge status: Home / Self Care | Payer: BC Managed Care – PPO | Source: Ambulatory Visit | Attending: Internal Medicine | Admitting: Internal Medicine

## 2020-05-26 DIAGNOSIS — I4891 Unspecified atrial fibrillation: Secondary | ICD-10-CM

## 2020-05-26 DIAGNOSIS — Z20822 Contact with and (suspected) exposure to covid-19: Secondary | ICD-10-CM | POA: Diagnosis not present

## 2020-05-26 LAB — SARS CORONAVIRUS 2 (TAT 6-24 HRS): SARS Coronavirus 2: NEGATIVE

## 2020-05-26 MED ORDER — IOHEXOL 350 MG/ML SOLN
80.0000 mL | Freq: Once | INTRAVENOUS | Status: AC | PRN
  Administered 2020-05-26: 80 mL via INTRAVENOUS

## 2020-05-28 NOTE — Anesthesia Preprocedure Evaluation (Addendum)
Anesthesia Evaluation  Patient identified by MRN, date of birth, ID band Patient awake    Reviewed: Allergy & Precautions, NPO status , Patient's Chart, lab work & pertinent test results, reviewed documented beta blocker date and time   Airway Mallampati: I  TM Distance: >3 FB Neck ROM: Full    Dental  (+) Teeth Intact, Dental Advisory Given   Pulmonary sleep apnea and Continuous Positive Airway Pressure Ventilation ,  05/26/2020 SARS coronavirus NEG   breath sounds clear to auscultation       Cardiovascular hypertension, Pt. on medications and Pt. on home beta blockers (-) angina+ dysrhythmias Atrial Fibrillation  Rhythm:Regular Rate:Normal  02/2020 ECHO: EF 50-55%, mild TR   Neuro/Psych negative neurological ROS     GI/Hepatic negative GI ROS, Neg liver ROS,   Endo/Other  diabetes (glu 131), Oral Hypoglycemic AgentsMorbid obesity  Renal/GU negative Renal ROS     Musculoskeletal   Abdominal (+) + obese,   Peds  Hematology eliquis   Anesthesia Other Findings   Reproductive/Obstetrics                            Anesthesia Physical Anesthesia Plan  ASA: III  Anesthesia Plan: General   Post-op Pain Management:    Induction: Intravenous  PONV Risk Score and Plan: 2 and Ondansetron and Dexamethasone  Airway Management Planned: Oral ETT  Additional Equipment: None  Intra-op Plan:   Post-operative Plan: Extubation in OR  Informed Consent: I have reviewed the patients History and Physical, chart, labs and discussed the procedure including the risks, benefits and alternatives for the proposed anesthesia with the patient or authorized representative who has indicated his/her understanding and acceptance.     Dental advisory given  Plan Discussed with: CRNA and Surgeon  Anesthesia Plan Comments:       Anesthesia Quick Evaluation

## 2020-05-29 ENCOUNTER — Ambulatory Visit (HOSPITAL_COMMUNITY)
Admission: RE | Admit: 2020-05-29 | Discharge: 2020-05-29 | Disposition: A | Discharge status: Home / Self Care | Payer: BC Managed Care – PPO | Attending: Internal Medicine | Admitting: Internal Medicine

## 2020-05-29 ENCOUNTER — Ambulatory Visit (HOSPITAL_COMMUNITY): Payer: BC Managed Care – PPO | Admitting: Certified Registered Nurse Anesthetist

## 2020-05-29 ENCOUNTER — Encounter (HOSPITAL_COMMUNITY): Payer: Self-pay | Admitting: Internal Medicine

## 2020-05-29 ENCOUNTER — Other Ambulatory Visit: Payer: Self-pay

## 2020-05-29 ENCOUNTER — Encounter (HOSPITAL_COMMUNITY)
Admission: RE | Disposition: A | Discharge status: Home / Self Care | Payer: Self-pay | Source: Home / Self Care | Attending: Internal Medicine

## 2020-05-29 DIAGNOSIS — R7303 Prediabetes: Secondary | ICD-10-CM | POA: Insufficient documentation

## 2020-05-29 DIAGNOSIS — Z6832 Body mass index (BMI) 32.0-32.9, adult: Secondary | ICD-10-CM | POA: Insufficient documentation

## 2020-05-29 DIAGNOSIS — E669 Obesity, unspecified: Secondary | ICD-10-CM | POA: Diagnosis not present

## 2020-05-29 DIAGNOSIS — Z79899 Other long term (current) drug therapy: Secondary | ICD-10-CM | POA: Diagnosis not present

## 2020-05-29 DIAGNOSIS — M109 Gout, unspecified: Secondary | ICD-10-CM | POA: Insufficient documentation

## 2020-05-29 DIAGNOSIS — Z7984 Long term (current) use of oral hypoglycemic drugs: Secondary | ICD-10-CM | POA: Insufficient documentation

## 2020-05-29 DIAGNOSIS — I4819 Other persistent atrial fibrillation: Secondary | ICD-10-CM | POA: Diagnosis present

## 2020-05-29 DIAGNOSIS — Z8249 Family history of ischemic heart disease and other diseases of the circulatory system: Secondary | ICD-10-CM | POA: Diagnosis not present

## 2020-05-29 DIAGNOSIS — I509 Heart failure, unspecified: Secondary | ICD-10-CM | POA: Diagnosis not present

## 2020-05-29 DIAGNOSIS — I110 Hypertensive heart disease with heart failure: Secondary | ICD-10-CM | POA: Diagnosis not present

## 2020-05-29 DIAGNOSIS — E785 Hyperlipidemia, unspecified: Secondary | ICD-10-CM | POA: Insufficient documentation

## 2020-05-29 DIAGNOSIS — Z9852 Vasectomy status: Secondary | ICD-10-CM | POA: Diagnosis not present

## 2020-05-29 DIAGNOSIS — G4733 Obstructive sleep apnea (adult) (pediatric): Secondary | ICD-10-CM | POA: Diagnosis not present

## 2020-05-29 DIAGNOSIS — Z7901 Long term (current) use of anticoagulants: Secondary | ICD-10-CM | POA: Insufficient documentation

## 2020-05-29 DIAGNOSIS — Z833 Family history of diabetes mellitus: Secondary | ICD-10-CM | POA: Diagnosis not present

## 2020-05-29 HISTORY — PX: ATRIAL FIBRILLATION ABLATION: EP1191

## 2020-05-29 LAB — GLUCOSE, CAPILLARY
Glucose-Capillary: 131 mg/dL — ABNORMAL HIGH (ref 70–99)
Glucose-Capillary: 153 mg/dL — ABNORMAL HIGH (ref 70–99)

## 2020-05-29 SURGERY — ATRIAL FIBRILLATION ABLATION
Anesthesia: General

## 2020-05-29 MED ORDER — HEPARIN SODIUM (PORCINE) 1000 UNIT/ML IJ SOLN
INTRAMUSCULAR | Status: DC | PRN
  Administered 2020-05-29: 14000 [IU] via INTRAVENOUS
  Administered 2020-05-29: 1000 [IU] via INTRAVENOUS

## 2020-05-29 MED ORDER — SODIUM CHLORIDE 0.9 % IV SOLN: INTRAVENOUS | Status: DC

## 2020-05-29 MED ORDER — DEXAMETHASONE SODIUM PHOSPHATE 10 MG/ML IJ SOLN
INTRAMUSCULAR | Status: DC | PRN
  Administered 2020-05-29: 4 mg via INTRAVENOUS

## 2020-05-29 MED ORDER — ONDANSETRON HCL 4 MG/2ML IJ SOLN: 4.0000 mg | Freq: Four times a day (QID) | INTRAMUSCULAR | Status: DC | PRN

## 2020-05-29 MED ORDER — HEPARIN (PORCINE) IN NACL 1000-0.9 UT/500ML-% IV SOLN
INTRAVENOUS | Status: AC
  Filled 2020-05-29: qty 500

## 2020-05-29 MED ORDER — MIDAZOLAM HCL 5 MG/5ML IJ SOLN
INTRAMUSCULAR | Status: DC | PRN
  Administered 2020-05-29: 2 mg via INTRAVENOUS

## 2020-05-29 MED ORDER — FENTANYL CITRATE (PF) 100 MCG/2ML IJ SOLN
INTRAMUSCULAR | Status: DC | PRN
  Administered 2020-05-29 (×2): 50 ug via INTRAVENOUS

## 2020-05-29 MED ORDER — HEPARIN SODIUM (PORCINE) 1000 UNIT/ML IJ SOLN
INTRAMUSCULAR | Status: AC
  Filled 2020-05-29: qty 1

## 2020-05-29 MED ORDER — HYDROCODONE-ACETAMINOPHEN 5-325 MG PO TABS
1.0000 | ORAL_TABLET | ORAL | Status: DC | PRN
Start: 2020-05-29 — End: 2020-05-29

## 2020-05-29 MED ORDER — HEPARIN SODIUM (PORCINE) 1000 UNIT/ML IJ SOLN
INTRAMUSCULAR | Status: DC | PRN
  Administered 2020-05-29 (×2): 5000 [IU] via INTRAVENOUS

## 2020-05-29 MED ORDER — SODIUM CHLORIDE 0.9% FLUSH: 3.0000 mL | Freq: Two times a day (BID) | INTRAVENOUS | Status: DC

## 2020-05-29 MED ORDER — HEPARIN SODIUM (PORCINE) 1000 UNIT/ML IJ SOLN
INTRAMUSCULAR | Status: AC
  Filled 2020-05-29: qty 2

## 2020-05-29 MED ORDER — PHENYLEPHRINE 40 MCG/ML (10ML) SYRINGE FOR IV PUSH (FOR BLOOD PRESSURE SUPPORT)
PREFILLED_SYRINGE | INTRAVENOUS | Status: DC | PRN
  Administered 2020-05-29: 80 ug via INTRAVENOUS

## 2020-05-29 MED ORDER — SODIUM CHLORIDE 0.9% FLUSH: 3.0000 mL | INTRAVENOUS | Status: DC | PRN

## 2020-05-29 MED ORDER — LIDOCAINE 2% (20 MG/ML) 5 ML SYRINGE
INTRAMUSCULAR | Status: DC | PRN
  Administered 2020-05-29: 40 mg via INTRAVENOUS

## 2020-05-29 MED ORDER — ONDANSETRON HCL 4 MG/2ML IJ SOLN
INTRAMUSCULAR | Status: DC | PRN
  Administered 2020-05-29: 4 mg via INTRAVENOUS

## 2020-05-29 MED ORDER — PROTAMINE SULFATE 10 MG/ML IV SOLN
INTRAVENOUS | Status: DC | PRN
Start: 2020-05-29 — End: 2020-05-29
  Administered 2020-05-29: 30 mg via INTRAVENOUS

## 2020-05-29 MED ORDER — APIXABAN 5 MG PO TABS: 5.0000 mg | ORAL_TABLET | Freq: Once | ORAL | Status: DC

## 2020-05-29 MED ORDER — SODIUM CHLORIDE 0.9 % IV SOLN: 250.0000 mL | INTRAVENOUS | Status: DC | PRN

## 2020-05-29 MED ORDER — ACETAMINOPHEN 325 MG PO TABS: 650.0000 mg | ORAL_TABLET | ORAL | Status: DC | PRN

## 2020-05-29 MED ORDER — HEPARIN (PORCINE) IN NACL 1000-0.9 UT/500ML-% IV SOLN
INTRAVENOUS | Status: DC | PRN
  Administered 2020-05-29: 500 mL

## 2020-05-29 MED ORDER — PHENYLEPHRINE HCL-NACL 10-0.9 MG/250ML-% IV SOLN
INTRAVENOUS | Status: DC | PRN
  Administered 2020-05-29: 25 ug/min via INTRAVENOUS

## 2020-05-29 MED ORDER — SUGAMMADEX SODIUM 200 MG/2ML IV SOLN
INTRAVENOUS | Status: DC | PRN
  Administered 2020-05-29: 250 mg via INTRAVENOUS

## 2020-05-29 MED ORDER — PROPOFOL 10 MG/ML IV BOLUS
INTRAVENOUS | Status: DC | PRN
  Administered 2020-05-29: 100 mg via INTRAVENOUS
  Administered 2020-05-29: 200 mg via INTRAVENOUS

## 2020-05-29 MED ORDER — PANTOPRAZOLE SODIUM 40 MG PO TBEC: 40.0000 mg | DELAYED_RELEASE_TABLET | Freq: Every day | ORAL | 0 refills | Status: DC

## 2020-05-29 MED ORDER — ROCURONIUM BROMIDE 10 MG/ML (PF) SYRINGE
PREFILLED_SYRINGE | INTRAVENOUS | Status: DC | PRN
  Administered 2020-05-29 (×2): 20 mg via INTRAVENOUS
  Administered 2020-05-29: 30 mg via INTRAVENOUS
  Administered 2020-05-29: 70 mg via INTRAVENOUS

## 2020-05-29 SURGICAL SUPPLY — 18 items
BLANKET WARM UNDERBOD FULL ACC (MISCELLANEOUS) ×2 IMPLANT
CATH MAPPNG PENTARAY F 2-6-2MM (CATHETERS) ×1 IMPLANT
CATH SMTCH THERMOCOOL SF DF (CATHETERS) ×2 IMPLANT
CATH SOUNDSTAR ECO 8FR (CATHETERS) ×2 IMPLANT
CATH WEBSTER BI DIR CS D-F CRV (CATHETERS) ×2 IMPLANT
COVER SWIFTLINK CONNECTOR (BAG) ×2 IMPLANT
DEVICE CLOSURE PERCLS PRGLD 6F (VASCULAR PRODUCTS) ×3 IMPLANT
NEEDLE BAYLIS TRANSSEPTAL 71CM (NEEDLE) ×2 IMPLANT
PACK EP LATEX FREE (CUSTOM PROCEDURE TRAY) ×1
PACK EP LF (CUSTOM PROCEDURE TRAY) ×1 IMPLANT
PAD PRO RADIOLUCENT 2001M-C (PAD) ×2 IMPLANT
PATCH CARTO3 (PAD) ×2 IMPLANT
PENTARAY F 2-6-2MM (CATHETERS) ×2
PERCLOSE PROGLIDE 6F (VASCULAR PRODUCTS) ×6
SHEATH PINNACLE 7F 10CM (SHEATH) ×4 IMPLANT
SHEATH PINNACLE 9F 10CM (SHEATH) ×2 IMPLANT
SHEATH SWARTZ TS SL2 63CM 8.5F (SHEATH) ×2 IMPLANT
TUBING SMART ABLATE COOLFLOW (TUBING) ×2 IMPLANT

## 2020-05-29 NOTE — Discharge Instructions (Signed)
Endovenous Ablation, Care After This sheet gives you information about how to care for yourself after your procedure. Your health care provider may also give you more specific instructions. If you have problems or questions, contact your health care provider. What can I expect after the procedure? After the procedure, it is common to have:  Bruising.  Tenderness. Follow these instructions at home: Incision care   Follow instructions from your health care provider about how to take care of your incision. Make sure you: ? Wash your hands with soap and water before and after you change your bandage (dressing). If soap and water are not available, use hand sanitizer. ? Change your dressing as told by your health care provider. ? Follow instructions from your health care provider about when you should remove your dressing.  Check your incision area every day for signs of infection. Check for: ? Redness, swelling, or pain. ? Fluid or blood. ? Warmth. ? Pus or a bad smell.  Keep the dressing dry until your health care provider says it can be removed. Activity  Avoid sitting for a long time without moving. Get up to take short walks every 1-2 hours. This is important to improve blood flow. Ask for help if you feel weak or unsteady.  Rest as told by your health care provider. General instructions  Take over-the-counter and prescription medicines only as told by your health care provider.  Do not drive for 24 hours if you were given a sedative during your procedure.  Do not take long car trips or travel by air until your health care provider has approved.  Wear compression stockings as told by your health care provider. These stockings help to prevent blood clots and reduce swelling in your legs.  Do not use any products that contain nicotine or tobacco, such as cigarettes, e-cigarettes, and chewing tobacco. These can delay incision healing after the procedure. If you need help quitting,  ask your health care provider.  Keep all follow-up visits as told by your health care provider. This is important. Contact a health care provider if you have:  A fever.  Redness, swelling, or pain at the site of your incision.  Fluid or blood coming from your incision.  Warmth at the incision area.  Pus or a bad smell coming from your incision. Get help right away if you:  Notice red streaks coming from the incision.  Develop nausea or vomiting.  Have trouble breathing.  Develop chest pain. Summary  Follow instructions from your health care provider about how to take care of your incision.  Avoid sitting for a long time without moving. Get up to take short walks every 1-2 hours.  Wear compression stockings as told by your health care provider. These stockings help to prevent blood clots and reduce swelling in your legs.  Contact a health care provider if you have redness, swelling, or pain at the site of your incision.  Keep all follow-up visits as told by your health care provider. This is important. This information is not intended to replace advice given to you by your health care provider. Make sure you discuss any questions you have with your health care provider. Document Revised: 08/02/2018 Document Reviewed: 08/02/2018 Elsevier Patient Education  2020 [employer].

## 2020-05-29 NOTE — Anesthesia Procedure Notes (Signed)
Procedure Name: Intubation Date/Time: 05/29/2020 8:02 AM Performed by: Colin Benton, CRNA Pre-anesthesia Checklist: Patient identified, Emergency Drugs available, Suction available and Patient being monitored Patient Re-evaluated:Patient Re-evaluated prior to induction Oxygen Delivery Method: Circle system utilized Preoxygenation: Pre-oxygenation with 100% oxygen Induction Type: IV induction Ventilation: Mask ventilation without difficulty Laryngoscope Size: Mac and 4 Grade View: Grade I Tube type: Oral Tube size: 7.5 mm Number of attempts: 1 Airway Equipment and Method: Stylet Placement Confirmation: ETT inserted through vocal cords under direct vision,  positive ETCO2 and breath sounds checked- equal and bilateral Secured at: 23 cm Tube secured with: Tape Dental Injury: Teeth and Oropharynx as per pre-operative assessment

## 2020-05-29 NOTE — Transfer of Care (Signed)
Immediate Anesthesia Transfer of Care Note  Patient: George Hatfield  Procedure(s) Performed: ATRIAL FIBRILLATION ABLATION (N/A )  Patient Location: Cath Lab  Anesthesia Type:General  Level of Consciousness: awake, alert , oriented and patient cooperative  Airway & Oxygen Therapy: Patient Spontanous Breathing and Patient connected to nasal cannula oxygen  Post-op Assessment: Report given to RN and Post -op Vital signs reviewed and stable  Post vital signs: Reviewed and stable  Last Vitals:  Vitals Value Taken Time  BP    Temp    Pulse 51 05/29/20 1039  Resp 10 05/29/20 1039  SpO2 97 % 05/29/20 1039  Vitals shown include unvalidated device data.  Last Pain:  Vitals:   05/29/20 0545  TempSrc:   PainSc: 0-No pain         Complications: No complications documented.

## 2020-05-29 NOTE — Anesthesia Postprocedure Evaluation (Signed)
Anesthesia Post Note  Patient: George Hatfield  Procedure(s) Performed: ATRIAL FIBRILLATION ABLATION (N/A )     Patient location during evaluation: Cath Lab Anesthesia Type: General Level of consciousness: awake and alert, oriented and patient cooperative Pain management: pain level controlled Vital Signs Assessment: post-procedure vital signs reviewed and stable Respiratory status: spontaneous breathing, nonlabored ventilation and respiratory function stable Cardiovascular status: blood pressure returned to baseline and stable Postop Assessment: no apparent nausea or vomiting Anesthetic complications: no   No complications documented.  Last Vitals:  Vitals:   05/29/20 1145 05/29/20 1150  BP: (!) 96/54 (!) 94/51  Pulse: (!) 56 (!) 54  Resp: 18 14  Temp:    SpO2: 97% 95%    Last Pain:  Vitals:   05/29/20 1110  TempSrc: Temporal  PainSc:                  Olanda Boughner,E. General Wearing

## 2020-05-29 NOTE — Progress Notes (Signed)
Eliquis not given as ordered.  Dr. Johney Frame aware.  Instructed patient to take when he gets home.

## 2020-05-29 NOTE — H&P (Signed)
Chief Complaint:  afib  History of Present Illness:    George Hatfield is a 64 y.o. male who presents for afib ablation.  The patient reports initially being diagnosed with afib.  He reports being diagnosed with AF by his apple watch in December.  In retrospect, he thinks that he has had afib since at least 09/2019.  He reports symptoms of palpitations and fatigue.  He has had some SOB and CHF symptoms.  He continues to have reduced.  + edema  He was evaluated by Dr Dulce Sellar and placed on eliquis and subsequently metoprolol and flecainide.  He failed medical therapy with flecainide.  He underwent cardioversion 02/15/2020 but has since returned to afib.        He has OSA and uses CPAP.  He has reduced ETOH to 6 beers per week.                                                                                                                                                                          Today, he denies symptoms of palpitations, chest pain, shortness of breath, orthopnea, PND, dizziness, presyncope, syncope, bleeding, or neurologic sequela. The patient is tolerating medications without difficulties and is otherwise without complaint today.         Past Medical History:  Diagnosis Date  . Abnormal glucose 01/27/2016  . 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 Hyperlipidemia 01/27/2016  . Hypertension, essential 08/01/2018  . Localized swelling of both lower legs 08/01/2018  . 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 Obesity, unspecified 02/02/2016  . OSA (obstructive sleep apnea) 01/27/2016   PSG 06/01/16 AHI 11.7, uses CPAP  . Persistent atrial fibrillation (HCC) 01/25/2020  . Pre-diabetes   . 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 KNEE ARTHROSCOPY    . VASECTOMY  1992          Current Outpatient Medications  Medication Sig Dispense Refill  . apixaban (ELIQUIS) 5 MG TABS tablet Take 1 tablet (5 mg total) by mouth 2 (two)  times daily. 60 tablet 4  . Cholecalciferol (VITAMIN D PO) Take 1 tablet by mouth daily.    . Coenzyme Q10 (CO Q 10 PO) Take 1 tablet by mouth daily.    . Cyanocobalamin (VITAMIN B-12 PO) Take 1 tablet by mouth daily.    . hydrochlorothiazide (HYDRODIURIL) 25 MG tablet Take 25 mg by mouth daily.    Marland Kitchen lisinopril (PRINIVIL,ZESTRIL) 20 MG tablet TAKE ONE TABLET BY MOUTH EVERY DAY    . metFORMIN (GLUCOPHAGE-XR) 500 MG 24 hr tablet Take 500 mg by mouth daily with breakfast.    . metoprolol succinate (TOPROL XL) 25 MG  24 hr tablet Take 1 tablet (25 mg total) by mouth daily. 90 tablet 3  . Nutritional Supplements (DHEA PO) Take 1 tablet by mouth daily.    . Omega-3 Fatty Acids (FISH OIL) 1000 MG CAPS Take 1 capsule by mouth daily.     . SOOLANTRA 1 % CREA Apply 1 application topically at bedtime.    . tadalafil (CIALIS) 5 MG tablet Take 1 tablet by mouth daily.    Marland Kitchen VITAMIN K PO Take 1 tablet by mouth daily.     No current facility-administered medications for this visit.    Allergies:   Patient has no known allergies.   Social History:  The patient  reports that he has never smoked. He has never used smokeless tobacco. He reports current alcohol use. He reports that he does not use drugs.   Family History:  The patient's family history includes Alzheimer's disease in his father; Breast cancer in his mother; Diabetes in his father and mother; Hypertension in his mother; Stroke in his father.    ROS:  Please see the history of present illness.   All other systems are personally reviewed and negative.   Physical Exam: Vitals:   05/29/20 0539  BP: (!) 157/109  Pulse: 72  Resp: 16  Temp: 98.5 F (36.9 C)  TempSrc: Oral  SpO2: 97%  Weight: 117.5 kg  Height: 6\' 3"  (1.905 m)    GEN- The patient is well appearing, alert and oriented x 3 today.   Head- normocephalic, atraumatic Eyes-  Sclera clear, conjunctiva pink Ears- hearing intact Oropharynx-  clear Neck- supple, Lungs- Clear to ausculation bilaterally, normal work of breathing Heart- Regular rate and rhythm, no murmurs, rubs or gallops, PMI not laterally displaced GI- soft, NT, ND, + BS Extremities- no clubbing, cyanosis, or edema, groin is without hematoma/ bruit MS- no significant deformity or atrophy Skin- no rash or lesion Psych- euthymic mood, full affect Neuro- strength and sensation are intact   Labs/Other Tests and Data Reviewed:    Recent Labs: 01/21/2020: Hemoglobin 17.9; Platelets 144 02/22/2020: NT-Pro BNP 724 02/25/2020: BUN 14; Creatinine, Ser 0.97; Potassium 4.2; Sodium 141      Wt Readings from Last 3 Encounters:  04/07/20 259 lb (117.5 kg)  02/25/20 263 lb (119.3 kg)  01/01/20 266 lb 12.8 oz (121 kg)     Other studies personally reviewed: Additional studies/ records that were reviewed today include: Dr Oren Binet notes, prior echo, ekgs  Review of the above records today demonstrates: as above  Cardiac CT and echo 2019 reviewed  ASSESSMENT & PLAN:    1.  Persistent afib The patient has symptomatic, recurrent persistent atrial fibrillation. he has failed medical therapy with flecainide. Chads2vasc score is 1.  he is anticoagulated with eliquis . Therapeutic strategies for afib including medicine and ablation were discussed in detail with the patient today. Risk, benefits, and alternatives to EP study and radiofrequency ablation for afib were also discussed in detail today. These risks include but are not limited to stroke, bleeding, vascular damage, tamponade, perforation, damage to the esophagus, lungs, and other structures, pulmonary vein stenosis, worsening renal function, and death. The patient understands these risk and wishes to proceed.    Cardiac CT reviewed with him.  He reports compliance with eliquis without interruption today.  Thompson Grayer MD, [place] 05/29/2020 7:42 AM

## 2020-05-30 ENCOUNTER — Encounter (HOSPITAL_COMMUNITY): Payer: Self-pay | Admitting: Internal Medicine

## 2020-05-30 LAB — POCT ACTIVATED CLOTTING TIME
Activated Clotting Time: 213 seconds
Activated Clotting Time: 230 seconds
Activated Clotting Time: 252 seconds

## 2020-05-30 MED FILL — Lidocaine HCl Local Preservative Free (PF) Inj 1%: INTRAMUSCULAR | Qty: 30 | Status: AC

## 2020-06-02 ENCOUNTER — Telehealth (HOSPITAL_COMMUNITY): Payer: Self-pay | Admitting: *Deleted

## 2020-06-02 MED ORDER — FUROSEMIDE 20 MG PO TABS: ORAL_TABLET | ORAL | 0 refills | Status: DC

## 2020-06-02 NOTE — Telephone Encounter (Signed)
Pt called in with report of 7lb weigh gain since ablation last week with abdominal swelling. Discussed with Jorja Loa PA will call in lasix 20mg  once a day for the next 3 days.

## 2020-06-26 ENCOUNTER — Other Ambulatory Visit: Payer: Self-pay

## 2020-06-26 ENCOUNTER — Encounter (HOSPITAL_COMMUNITY): Payer: Self-pay | Admitting: Physician Assistant

## 2020-06-26 ENCOUNTER — Ambulatory Visit (HOSPITAL_COMMUNITY)
Admission: RE | Admit: 2020-06-26 | Discharge: 2020-06-26 | Disposition: A | Discharge status: Home / Self Care | Payer: BC Managed Care – PPO | Source: Ambulatory Visit | Attending: Physician Assistant | Admitting: Physician Assistant

## 2020-06-26 VITALS — BP 152/98 | HR 149 | Ht 75.0 in | Wt 262.6 lb

## 2020-06-26 DIAGNOSIS — E785 Hyperlipidemia, unspecified: Secondary | ICD-10-CM | POA: Diagnosis not present

## 2020-06-26 DIAGNOSIS — E669 Obesity, unspecified: Secondary | ICD-10-CM | POA: Insufficient documentation

## 2020-06-26 DIAGNOSIS — G4733 Obstructive sleep apnea (adult) (pediatric): Secondary | ICD-10-CM | POA: Insufficient documentation

## 2020-06-26 DIAGNOSIS — R7303 Prediabetes: Secondary | ICD-10-CM | POA: Diagnosis not present

## 2020-06-26 DIAGNOSIS — Z79899 Other long term (current) drug therapy: Secondary | ICD-10-CM | POA: Insufficient documentation

## 2020-06-26 DIAGNOSIS — I4819 Other persistent atrial fibrillation: Secondary | ICD-10-CM | POA: Diagnosis present

## 2020-06-26 DIAGNOSIS — Z833 Family history of diabetes mellitus: Secondary | ICD-10-CM | POA: Insufficient documentation

## 2020-06-26 DIAGNOSIS — Z8249 Family history of ischemic heart disease and other diseases of the circulatory system: Secondary | ICD-10-CM | POA: Insufficient documentation

## 2020-06-26 DIAGNOSIS — Z7901 Long term (current) use of anticoagulants: Secondary | ICD-10-CM | POA: Insufficient documentation

## 2020-06-26 DIAGNOSIS — Z7984 Long term (current) use of oral hypoglycemic drugs: Secondary | ICD-10-CM | POA: Diagnosis not present

## 2020-06-26 DIAGNOSIS — I10 Essential (primary) hypertension: Secondary | ICD-10-CM | POA: Diagnosis not present

## 2020-06-26 DIAGNOSIS — Z6832 Body mass index (BMI) 32.0-32.9, adult: Secondary | ICD-10-CM | POA: Insufficient documentation

## 2020-06-26 MED ORDER — METOPROLOL SUCCINATE ER 25 MG PO TB24
25.0000 mg | ORAL_TABLET | Freq: Two times a day (BID) | ORAL | Status: DC
Start: 2020-06-26 — End: 2020-07-08

## 2020-06-26 NOTE — Progress Notes (Signed)
Primary Care Physician: Everlean Cherry, MD Primary Cardiologist: Dr Dulce Sellar Primary Electrophysiologist: Dr Johney Frame Referring Physician: Dr Verta Ellen Szatkowski is a 64 y.o. male with a history of HTN, OSA, HLD, persistent atrial fibrillation who presents for follow up in the [facility].  The patient was initially diagnosed with atrial fibrillation 11/2019 after presenting with symptoms of palpitations, fatigue, and SOB. Patient is on Eliquis for a CHADS2VASC score of 1. He was started on metoprolol and flecainide and underwent DCCV on 02/15/20 but had reoccurrence of his afib. He underwent afib ablation with Dr Johney Frame on 05/29/20. Patient reports he felt he was back in afib about two weeks post ablation with symptoms of palpitations. His smart watch shows variable heart rates. He is unsure if he has been in afib constantly. He denies CP, swallowing, or groin issues.   Today, he denies symptoms of chest pain, shortness of breath, orthopnea, PND, lower extremity edema, dizziness, presyncope, syncope, snoring, daytime somnolence, bleeding, or neurologic sequela. The patient is tolerating medications without difficulties and is otherwise without complaint today.    Atrial Fibrillation Risk Factors:  he does have symptoms or diagnosis of sleep apnea. he is compliant with CPAP therapy. he does not have a history of rheumatic fever. he does have a history of alcohol use.   he has a BMI of Body mass index is 32.82 kg/m.Marland Kitchen Filed Weights   06/26/20 0912  Weight: 119.1 kg    Family History  Problem Relation Age of Onset  . Breast cancer Mother   . Diabetes Mother   . Hypertension Mother   . Alzheimer's disease Father   . Diabetes Father   . Stroke Father      Atrial Fibrillation Management history:  Previous antiarrhythmic drugs: flecainide Previous cardioversions: 02/15/20 Previous ablations: 05/29/20 CHADS2VASC score: 1 Anticoagulation history:  Eliquis   Past Medical History:  Diagnosis Date  . Abnormal glucose 01/27/2016  . 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 Hyperlipidemia 01/27/2016  . Hypertension, essential 08/01/2018  . Localized swelling of both lower legs 08/01/2018  . 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 Obesity, unspecified 02/02/2016  . OSA (obstructive sleep apnea) 01/27/2016   PSG 06/01/16 AHI 11.7, uses CPAP  . Persistent atrial fibrillation (HCC) 01/25/2020  . Pre-diabetes   . 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   Past Surgical History:  Procedure Laterality Date  . ATRIAL FIBRILLATION ABLATION N/A 05/29/2020   Procedure: ATRIAL FIBRILLATION ABLATION;  Surgeon: Hillis Range, MD;  Location: [facility];  Service: Cardiovascular;  Laterality: N/A;  . KNEE ARTHROSCOPY    . VASECTOMY  1992    Current Outpatient Medications  Medication Sig Dispense Refill  . apixaban (ELIQUIS) 5 MG TABS tablet Take 1 tablet (5 mg total) by mouth 2 (two) times daily. 60 tablet 5  . Coenzyme Q10 (CO Q 10 PO) Take 1 tablet by mouth daily.    . Cyanocobalamin (VITAMIN B-12 PO) Take 1 tablet by mouth daily.    Marland Kitchen doxycycline (VIBRAMYCIN) 100 MG capsule Take 100 mg by mouth 2 (two) times daily.    Marland Kitchen ESTROGENS CONJUGATED PO Take 1 tablet by mouth in the morning and at bedtime. Supplement for men    . furosemide (LASIX) 20 MG tablet Take 1 tablet by mouth daily for 3 days then only as needed for weight gain/swelling 15 tablet 0  . lisinopril (PRINIVIL,ZESTRIL) 20 MG tablet Take 20 mg by mouth daily.     Marland Kitchen  MAGNESIUM PO Take 1 tablet by mouth daily.    . metFORMIN (GLUCOPHAGE-XR) 500 MG 24 hr tablet Take 500 mg by mouth at bedtime.     . metoprolol succinate (TOPROL XL) 25 MG 24 hr tablet Take 1 tablet (25 mg total) by mouth in the morning and at bedtime.    . Multiple Vitamins-Minerals (ZINC PO) Take 1 tablet by mouth daily.     . Nutritional Supplements (DHEA PO) Take 1 tablet by mouth See admin instructions. Five days a week     . Omega-3 Fatty Acids (FISH OIL) 1000 MG CAPS Take 1 capsule by mouth daily.     Marland Kitchen OVER THE COUNTER MEDICATION Take 1 tablet by mouth daily. CuraMed    . pantoprazole (PROTONIX) 40 MG tablet Take 1 tablet (40 mg total) by mouth daily. 45 tablet 0  . Probiotic Product (PROBIOTIC DAILY PO) Take 1 tablet by mouth daily.    . SOOLANTRA 1 % CREA Apply 1 application topically at bedtime.    . tadalafil (CIALIS) 5 MG tablet Take 5 mg by mouth daily.     Marland Kitchen VITAMIN K PO Take 1 tablet by mouth daily. Vit d3     No current facility-administered medications for this encounter.    No Known Allergies  Social History   Socioeconomic History  . Marital status: Divorced    Spouse name: Not on file  . Number of children: Not on file  . Years of education: Not on file  . Highest education level: Not on file  Occupational History  . Not on file  Tobacco Use  . Smoking status: Never Smoker  . Smokeless tobacco: Never Used  Vaping Use  . Vaping Use: Never used  Substance and Sexual Activity  . Alcohol use: Yes    Alcohol/week: 2.0 - 3.0 standard drinks    Types: 2 - 3 Cans of beer per week  . Drug use: Never  . Sexual activity: Not on file  Other Topics Concern  . Not on file  Social History Narrative   Lives in [place] alone.  Divorced.   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 Difficulty of Paying Living Expenses:   Food Insecurity:   . Worried About Programme researcher, broadcasting/film/video in the Last Year:   . Barista in the Last Year:   Transportation Needs:   . Freight forwarder (Medical):   Marland Kitchen Lack of Transportation (Non-Medical):   Physical Activity:   . Days of Exercise per Week:   . Minutes of Exercise per Session:   Stress:   . Feeling of Stress :   Social Connections:   . Frequency of Communication with Friends and Family:   . Frequency of Social Gatherings with Friends and Family:   . Attends Religious Services:   .  Active Member of Clubs or Organizations:   . Attends Banker Meetings:   Marland Kitchen Marital Status:   Intimate Partner Violence:   . Fear of Current or Ex-Partner:   . Emotionally Abused:   Marland Kitchen Physically Abused:   . Sexually Abused:      ROS- All systems are reviewed and negative except as per the HPI above.  Physical Exam: Vitals:   06/26/20 0912  BP: (!) 152/98  Pulse: (!) 149  Weight: 119.1 kg  Height: 6\' 3"  (1.905 m)    GEN- The patient is well appearing obese male,  alert and oriented x 3 today.   Head- normocephalic, atraumatic Eyes-  Sclera clear, conjunctiva pink Ears- hearing intact Oropharynx- clear Neck- supple  Lungs- Clear to ausculation bilaterally, normal work of breathing Heart- irregular rate and rhythm, tachycardia, no murmurs, rubs or gallops  GI- soft, NT, ND, + BS Extremities- no clubbing, cyanosis, or edema MS- no significant deformity or atrophy Skin- no rash or lesion Psych- euthymic mood, full affect Neuro- strength and sensation are intact  Wt Readings from Last 3 Encounters:  06/26/20 119.1 kg  05/29/20 117.5 kg  04/07/20 117.5 kg    EKG today demonstrates afib HR 149, QRS 100, QTc 497  Epic records are reviewed at length today  CHA2DS2-VASc Score = 1  The patient's score is based upon: CHF History: 0 HTN History: 1 Age : 0 Diabetes History: 0 Stroke History: 0 Vascular Disease History: 0 Gender: 0      ASSESSMENT AND PLAN: 1. Persistent Atrial Fibrillation (ICD10:  I48.19) The patient's CHA2DS2-VASc score is 1, indicating a 0.6% annual risk of stroke. S/p afib ablation 05/29/20. Patient back in afib today. Unclear if he is paroxysmal or persistent. Reassured patient that some afib is common for the first 3 months post ablation. Patient to track heart rate and rhythm over the weekend using Apple Watch. If he is paroxysmal, will plan to resume flecainide 50 mg BID. If he is persistent, will plan for DCCV.  Increase Toprol to  25 mg BID for better rate control. Continue Eliquis 5 mg BID with no missed doses for 3 months post ablation.   2. Obesity Body mass index is 32.82 kg/m. Lifestyle modification was discussed at length including regular exercise and weight reduction.  3. Obstructive sleep apnea Encouraged compliance with CPAP therapy.   4. HTN Elevated today, increase BB as above.   Follow up with Dr Munley and Dr Allred as scheduled. Patient to call clinic early next week with update.    Ricky Tarahji Ramthun PA-C [facility] [address] [place], [place] [address] [phone] 06/26/2020 10:01 AM  

## 2020-06-26 NOTE — H&P (View-Only) (Signed)
Primary Care Physician: Everlean Cherry, MD Primary Cardiologist: Dr Dulce Sellar Primary Electrophysiologist: Dr Johney Frame Referring Physician: Dr Verta Ellen Szatkowski is a 64 y.o. male with a history of HTN, OSA, HLD, persistent atrial fibrillation who presents for follow up in the [facility].  The patient was initially diagnosed with atrial fibrillation 11/2019 after presenting with symptoms of palpitations, fatigue, and SOB. Patient is on Eliquis for a CHADS2VASC score of 1. He was started on metoprolol and flecainide and underwent DCCV on 02/15/20 but had reoccurrence of his afib. He underwent afib ablation with Dr Johney Frame on 05/29/20. Patient reports he felt he was back in afib about two weeks post ablation with symptoms of palpitations. His smart watch shows variable heart rates. He is unsure if he has been in afib constantly. He denies CP, swallowing, or groin issues.   Today, he denies symptoms of chest pain, shortness of breath, orthopnea, PND, lower extremity edema, dizziness, presyncope, syncope, snoring, daytime somnolence, bleeding, or neurologic sequela. The patient is tolerating medications without difficulties and is otherwise without complaint today.    Atrial Fibrillation Risk Factors:  he does have symptoms or diagnosis of sleep apnea. he is compliant with CPAP therapy. he does not have a history of rheumatic fever. he does have a history of alcohol use.   he has a BMI of Body mass index is 32.82 kg/m.Marland Kitchen Filed Weights   06/26/20 0912  Weight: 119.1 kg    Family History  Problem Relation Age of Onset  . Breast cancer Mother   . Diabetes Mother   . Hypertension Mother   . Alzheimer's disease Father   . Diabetes Father   . Stroke Father      Atrial Fibrillation Management history:  Previous antiarrhythmic drugs: flecainide Previous cardioversions: 02/15/20 Previous ablations: 05/29/20 CHADS2VASC score: 1 Anticoagulation history:  Eliquis   Past Medical History:  Diagnosis Date  . Abnormal glucose 01/27/2016  . 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 Hyperlipidemia 01/27/2016  . Hypertension, essential 08/01/2018  . Localized swelling of both lower legs 08/01/2018  . 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 Obesity, unspecified 02/02/2016  . OSA (obstructive sleep apnea) 01/27/2016   PSG 06/01/16 AHI 11.7, uses CPAP  . Persistent atrial fibrillation (HCC) 01/25/2020  . Pre-diabetes   . 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   Past Surgical History:  Procedure Laterality Date  . ATRIAL FIBRILLATION ABLATION N/A 05/29/2020   Procedure: ATRIAL FIBRILLATION ABLATION;  Surgeon: Hillis Range, MD;  Location: [facility] INVASIVE CV LAB;  Service: Cardiovascular;  Laterality: N/A;  . KNEE ARTHROSCOPY    . VASECTOMY  1992    Current Outpatient Medications  Medication Sig Dispense Refill  . apixaban (ELIQUIS) 5 MG TABS tablet Take 1 tablet (5 mg total) by mouth 2 (two) times daily. 60 tablet 5  . Coenzyme Q10 (CO Q 10 PO) Take 1 tablet by mouth daily.    . Cyanocobalamin (VITAMIN B-12 PO) Take 1 tablet by mouth daily.    Marland Kitchen doxycycline (VIBRAMYCIN) 100 MG capsule Take 100 mg by mouth 2 (two) times daily.    Marland Kitchen ESTROGENS CONJUGATED PO Take 1 tablet by mouth in the morning and at bedtime. Supplement for men    . furosemide (LASIX) 20 MG tablet Take 1 tablet by mouth daily for 3 days then only as needed for weight gain/swelling 15 tablet 0  . lisinopril (PRINIVIL,ZESTRIL) 20 MG tablet Take 20 mg by mouth daily.     Marland Kitchen  MAGNESIUM PO Take 1 tablet by mouth daily.    . metFORMIN (GLUCOPHAGE-XR) 500 MG 24 hr tablet Take 500 mg by mouth at bedtime.     . metoprolol succinate (TOPROL XL) 25 MG 24 hr tablet Take 1 tablet (25 mg total) by mouth in the morning and at bedtime.    . Multiple Vitamins-Minerals (ZINC PO) Take 1 tablet by mouth daily.     . Nutritional Supplements (DHEA PO) Take 1 tablet by mouth See admin instructions. Five days a week     . Omega-3 Fatty Acids (FISH OIL) 1000 MG CAPS Take 1 capsule by mouth daily.     Marland Kitchen OVER THE COUNTER MEDICATION Take 1 tablet by mouth daily. CuraMed    . pantoprazole (PROTONIX) 40 MG tablet Take 1 tablet (40 mg total) by mouth daily. 45 tablet 0  . Probiotic Product (PROBIOTIC DAILY PO) Take 1 tablet by mouth daily.    . SOOLANTRA 1 % CREA Apply 1 application topically at bedtime.    . tadalafil (CIALIS) 5 MG tablet Take 5 mg by mouth daily.     Marland Kitchen VITAMIN K PO Take 1 tablet by mouth daily. Vit d3     No current facility-administered medications for this encounter.    No Known Allergies  Social History   Socioeconomic History  . Marital status: Divorced    Spouse name: Not on file  . Number of children: Not on file  . Years of education: Not on file  . Highest education level: Not on file  Occupational History  . Not on file  Tobacco Use  . Smoking status: Never Smoker  . Smokeless tobacco: Never Used  Vaping Use  . Vaping Use: Never used  Substance and Sexual Activity  . Alcohol use: Yes    Alcohol/week: 2.0 - 3.0 standard drinks    Types: 2 - 3 Cans of beer per week  . Drug use: Never  . Sexual activity: Not on file  Other Topics Concern  . Not on file  Social History Narrative   Lives in [place] alone.  Divorced.   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 Difficulty of Paying Living Expenses:   Food Insecurity:   . Worried About Programme researcher, broadcasting/film/video in the Last Year:   . Barista in the Last Year:   Transportation Needs:   . Freight forwarder (Medical):   Marland Kitchen Lack of Transportation (Non-Medical):   Physical Activity:   . Days of Exercise per Week:   . Minutes of Exercise per Session:   Stress:   . Feeling of Stress :   Social Connections:   . Frequency of Communication with Friends and Family:   . Frequency of Social Gatherings with Friends and Family:   . Attends Religious Services:   .  Active Member of Clubs or Organizations:   . Attends Banker Meetings:   Marland Kitchen Marital Status:   Intimate Partner Violence:   . Fear of Current or Ex-Partner:   . Emotionally Abused:   Marland Kitchen Physically Abused:   . Sexually Abused:      ROS- All systems are reviewed and negative except as per the HPI above.  Physical Exam: Vitals:   06/26/20 0912  BP: (!) 152/98  Pulse: (!) 149  Weight: 119.1 kg  Height: 6\' 3"  (1.905 m)    GEN- The patient is well appearing obese male,  alert and oriented x 3 today.   Head- normocephalic, atraumatic Eyes-  Sclera clear, conjunctiva pink Ears- hearing intact Oropharynx- clear Neck- supple  Lungs- Clear to ausculation bilaterally, normal work of breathing Heart- irregular rate and rhythm, tachycardia, no murmurs, rubs or gallops  GI- soft, NT, ND, + BS Extremities- no clubbing, cyanosis, or edema MS- no significant deformity or atrophy Skin- no rash or lesion Psych- euthymic mood, full affect Neuro- strength and sensation are intact  Wt Readings from Last 3 Encounters:  06/26/20 119.1 kg  05/29/20 117.5 kg  04/07/20 117.5 kg    EKG today demonstrates afib HR 149, QRS 100, QTc 497  Epic records are reviewed at length today  CHA2DS2-VASc Score = 1  The patient's score is based upon: CHF History: 0 HTN History: 1 Age : 0 Diabetes History: 0 Stroke History: 0 Vascular Disease History: 0 Gender: 0      ASSESSMENT AND PLAN: 1. Persistent Atrial Fibrillation (ICD10:  I48.19) The patient's CHA2DS2-VASc score is 1, indicating a 0.6% annual risk of stroke. S/p afib ablation 05/29/20. Patient back in afib today. Unclear if he is paroxysmal or persistent. Reassured patient that some afib is common for the first 3 months post ablation. Patient to track heart rate and rhythm over the weekend using Apple Watch. If he is paroxysmal, will plan to resume flecainide 50 mg BID. If he is persistent, will plan for DCCV.  Increase Toprol to  25 mg BID for better rate control. Continue Eliquis 5 mg BID with no missed doses for 3 months post ablation.   2. Obesity Body mass index is 32.82 kg/m. Lifestyle modification was discussed at length including regular exercise and weight reduction.  3. Obstructive sleep apnea Encouraged compliance with CPAP therapy.   4. HTN Elevated today, increase BB as above.   Follow up with Dr Dulce Sellar and Dr Johney Frame as scheduled. Patient to call clinic early next week with update.    Jorja Loa PA-C [facility] [address] [place], [place] [address] [phone] 06/26/2020 10:01 AM

## 2020-06-30 ENCOUNTER — Other Ambulatory Visit (HOSPITAL_COMMUNITY): Payer: Self-pay | Admitting: *Deleted

## 2020-06-30 DIAGNOSIS — I4819 Other persistent atrial fibrillation: Secondary | ICD-10-CM

## 2020-07-01 ENCOUNTER — Telehealth (HOSPITAL_COMMUNITY): Payer: Self-pay | Admitting: *Deleted

## 2020-07-01 NOTE — Telephone Encounter (Signed)
Pt continues in persistent AF. HRs are more controlled since last visit. Per Jorja Loa PA will hold off on flecainide start since pt is persistent - to follow up with Dr. Dulce Sellar after cardioversion. Pt to let clinic know if AF should return post dccv. PT having labs drawn at [facility] office. PT in agreement.

## 2020-07-04 ENCOUNTER — Other Ambulatory Visit (HOSPITAL_COMMUNITY)
Admission: RE | Admit: 2020-07-04 | Discharge: 2020-07-04 | Disposition: A | Discharge status: Home / Self Care | Payer: BC Managed Care – PPO | Source: Ambulatory Visit | Attending: Cardiology | Admitting: Cardiology

## 2020-07-04 ENCOUNTER — Other Ambulatory Visit: Payer: Self-pay | Admitting: Emergency Medicine

## 2020-07-04 ENCOUNTER — Other Ambulatory Visit (HOSPITAL_COMMUNITY): Payer: Self-pay

## 2020-07-04 DIAGNOSIS — E785 Hyperlipidemia, unspecified: Secondary | ICD-10-CM | POA: Diagnosis not present

## 2020-07-04 DIAGNOSIS — Z20822 Contact with and (suspected) exposure to covid-19: Secondary | ICD-10-CM | POA: Insufficient documentation

## 2020-07-04 DIAGNOSIS — G4733 Obstructive sleep apnea (adult) (pediatric): Secondary | ICD-10-CM | POA: Diagnosis not present

## 2020-07-04 DIAGNOSIS — Z7901 Long term (current) use of anticoagulants: Secondary | ICD-10-CM | POA: Diagnosis not present

## 2020-07-04 DIAGNOSIS — I10 Essential (primary) hypertension: Secondary | ICD-10-CM

## 2020-07-04 DIAGNOSIS — I480 Paroxysmal atrial fibrillation: Secondary | ICD-10-CM

## 2020-07-04 DIAGNOSIS — Z6832 Body mass index (BMI) 32.0-32.9, adult: Secondary | ICD-10-CM | POA: Diagnosis not present

## 2020-07-04 DIAGNOSIS — I4819 Other persistent atrial fibrillation: Secondary | ICD-10-CM | POA: Diagnosis present

## 2020-07-04 DIAGNOSIS — E669 Obesity, unspecified: Secondary | ICD-10-CM | POA: Diagnosis not present

## 2020-07-04 DIAGNOSIS — R7303 Prediabetes: Secondary | ICD-10-CM | POA: Diagnosis not present

## 2020-07-04 DIAGNOSIS — Z7984 Long term (current) use of oral hypoglycemic drugs: Secondary | ICD-10-CM | POA: Diagnosis not present

## 2020-07-04 DIAGNOSIS — Z79899 Other long term (current) drug therapy: Secondary | ICD-10-CM | POA: Diagnosis not present

## 2020-07-04 DIAGNOSIS — Z01812 Encounter for preprocedural laboratory examination: Secondary | ICD-10-CM | POA: Insufficient documentation

## 2020-07-04 LAB — SARS CORONAVIRUS 2 (TAT 6-24 HRS): SARS Coronavirus 2: NEGATIVE

## 2020-07-04 NOTE — Progress Notes (Signed)
Pre call done for endo procedure Monday 07/07/20. Patient got covid tested today and will remain quarantined over the weekend. Patient states he will take blood thinner morning of procedure but be NPO otherwise. Patient confirms has a ride home post procedure. All questions addressed.

## 2020-07-05 LAB — CBC
Hematocrit: 50.2 % (ref 37.5–51.0)
Hemoglobin: 17.3 g/dL (ref 13.0–17.7)
MCH: 31.1 pg (ref 26.6–33.0)
MCHC: 34.5 g/dL (ref 31.5–35.7)
MCV: 90 fL (ref 79–97)
Platelets: 132 10*3/uL — ABNORMAL LOW (ref 150–450)
RBC: 5.57 x10E6/uL (ref 4.14–5.80)
RDW: 11.9 % (ref 11.6–15.4)
WBC: 7.9 10*3/uL (ref 3.4–10.8)

## 2020-07-05 LAB — BASIC METABOLIC PANEL
BUN/Creatinine Ratio: 9 — ABNORMAL LOW (ref 10–24)
BUN: 9 mg/dL (ref 8–27)
CO2: 22 mmol/L (ref 20–29)
Calcium: 8.8 mg/dL (ref 8.6–10.2)
Chloride: 102 mmol/L (ref 96–106)
Creatinine, Ser: 0.99 mg/dL (ref 0.76–1.27)
GFR calc Af Amer: 93 mL/min/{1.73_m2} (ref >59)
GFR calc non Af Amer: 81 mL/min/{1.73_m2} (ref >59)
Glucose: 168 mg/dL — ABNORMAL HIGH (ref 65–99)
Potassium: 4 mmol/L (ref 3.5–5.2)
Sodium: 138 mmol/L (ref 134–144)

## 2020-07-07 ENCOUNTER — Ambulatory Visit (HOSPITAL_COMMUNITY)
Admission: RE | Admit: 2020-07-07 | Discharge: 2020-07-07 | Disposition: A | Discharge status: Home / Self Care | Payer: BC Managed Care – PPO | Attending: Cardiology | Admitting: Cardiology

## 2020-07-07 ENCOUNTER — Encounter (HOSPITAL_COMMUNITY)
Admission: RE | Disposition: A | Discharge status: Home / Self Care | Payer: Self-pay | Source: Home / Self Care | Attending: Cardiology

## 2020-07-07 ENCOUNTER — Ambulatory Visit (HOSPITAL_COMMUNITY): Payer: BC Managed Care – PPO | Admitting: Registered Nurse

## 2020-07-07 ENCOUNTER — Other Ambulatory Visit: Payer: Self-pay

## 2020-07-07 ENCOUNTER — Encounter (HOSPITAL_COMMUNITY): Payer: Self-pay | Admitting: Cardiology

## 2020-07-07 DIAGNOSIS — G4733 Obstructive sleep apnea (adult) (pediatric): Secondary | ICD-10-CM | POA: Insufficient documentation

## 2020-07-07 DIAGNOSIS — I4819 Other persistent atrial fibrillation: Secondary | ICD-10-CM

## 2020-07-07 DIAGNOSIS — I10 Essential (primary) hypertension: Secondary | ICD-10-CM | POA: Insufficient documentation

## 2020-07-07 DIAGNOSIS — E785 Hyperlipidemia, unspecified: Secondary | ICD-10-CM | POA: Insufficient documentation

## 2020-07-07 DIAGNOSIS — Z6832 Body mass index (BMI) 32.0-32.9, adult: Secondary | ICD-10-CM | POA: Insufficient documentation

## 2020-07-07 DIAGNOSIS — E669 Obesity, unspecified: Secondary | ICD-10-CM | POA: Insufficient documentation

## 2020-07-07 DIAGNOSIS — Z7984 Long term (current) use of oral hypoglycemic drugs: Secondary | ICD-10-CM | POA: Insufficient documentation

## 2020-07-07 DIAGNOSIS — R7303 Prediabetes: Secondary | ICD-10-CM | POA: Insufficient documentation

## 2020-07-07 DIAGNOSIS — Z79899 Other long term (current) drug therapy: Secondary | ICD-10-CM | POA: Insufficient documentation

## 2020-07-07 DIAGNOSIS — Z7901 Long term (current) use of anticoagulants: Secondary | ICD-10-CM | POA: Insufficient documentation

## 2020-07-07 HISTORY — PX: CARDIOVERSION: SHX1299

## 2020-07-07 LAB — POCT I-STAT, CHEM 8
BUN: 16 mg/dL (ref 8–23)
Calcium, Ion: 1.1 mmol/L — ABNORMAL LOW (ref 1.15–1.40)
Chloride: 105 mmol/L (ref 98–111)
Creatinine, Ser: 0.9 mg/dL (ref 0.61–1.24)
Glucose, Bld: 128 mg/dL — ABNORMAL HIGH (ref 70–99)
HCT: 52 % (ref 39.0–52.0)
Hemoglobin: 17.7 g/dL — ABNORMAL HIGH (ref 13.0–17.0)
Potassium: 5.2 mmol/L — ABNORMAL HIGH (ref 3.5–5.1)
Sodium: 141 mmol/L (ref 135–145)
TCO2: 28 mmol/L (ref 22–32)

## 2020-07-07 SURGERY — CARDIOVERSION
Anesthesia: General

## 2020-07-07 MED ORDER — SODIUM CHLORIDE 0.9 % IV SOLN
INTRAVENOUS | Status: DC | PRN
Start: 2020-07-07 — End: 2020-07-07

## 2020-07-07 MED ORDER — PROPOFOL 10 MG/ML IV BOLUS
INTRAVENOUS | Status: DC | PRN
  Administered 2020-07-07: 20 mg via INTRAVENOUS
  Administered 2020-07-07: 60 mg via INTRAVENOUS

## 2020-07-07 MED ORDER — LIDOCAINE 2% (20 MG/ML) 5 ML SYRINGE
INTRAMUSCULAR | Status: DC | PRN
  Administered 2020-07-07: 60 mg via INTRAVENOUS

## 2020-07-07 NOTE — Interval H&P Note (Signed)
History and Physical Interval Note:  07/07/2020 10:04 AM  George Hatfield  has presented today for surgery, with the diagnosis of A-FIB.  The various methods of treatment have been discussed with the patient and family. After consideration of risks, benefits and other options for treatment, the patient has consented to  Procedure(s): CARDIOVERSION (N/A) as a surgical intervention.  The patient's history has been reviewed, patient examined, no change in status, stable for surgery.  I have reviewed the patient's chart and labs.  Questions were answered to the patient's satisfaction.     [employer]

## 2020-07-07 NOTE — Anesthesia Postprocedure Evaluation (Signed)
Anesthesia Post Note  Patient: George Hatfield  Procedure(s) Performed: CARDIOVERSION (N/A )     Patient location during evaluation: Endoscopy Anesthesia Type: General Level of consciousness: awake and alert Pain management: pain level controlled Vital Signs Assessment: post-procedure vital signs reviewed and stable Respiratory status: spontaneous breathing, nonlabored ventilation and respiratory function stable Cardiovascular status: blood pressure returned to baseline and stable Postop Assessment: no apparent nausea or vomiting Anesthetic complications: no   No complications documented.  Last Vitals:  Vitals:   07/07/20 1101 07/07/20 1104  BP:  113/66  Pulse: 52 59  Resp: 12 21  Temp:    SpO2: 92% 94%    Last Pain:  Vitals:   07/07/20 1045  TempSrc: Oral  PainSc:                  Lucretia Kern

## 2020-07-07 NOTE — Anesthesia Procedure Notes (Signed)
Date/Time: 07/07/2020 10:10 AM Performed by: Laruth Bouchard., CRNA Pre-anesthesia Checklist: Patient identified, Emergency Drugs available, Suction available, Patient being monitored and Timeout performed Patient Re-evaluated:Patient Re-evaluated prior to induction Oxygen Delivery Method: Ambu bag Preoxygenation: Pre-oxygenation with 100% oxygen Induction Type: IV induction Placement Confirmation: positive ETCO2

## 2020-07-07 NOTE — Transfer of Care (Signed)
Immediate Anesthesia Transfer of Care Note  Patient: George Hatfield  Procedure(s) Performed: CARDIOVERSION (N/A )  Patient Location: PACU and Endoscopy Unit  Anesthesia Type:General  Level of Consciousness: awake and drowsy  Airway & Oxygen Therapy: Patient Spontanous Breathing  Post-op Assessment: Report given to RN and Post -op Vital signs reviewed and stable  Post vital signs: Reviewed and stable  Last Vitals:  Vitals Value Taken Time  BP    Temp    Pulse    Resp    SpO2      Last Pain:  Vitals:   07/07/20 0944  TempSrc: Oral  PainSc: 0-No pain         Complications: No complications documented.

## 2020-07-07 NOTE — Anesthesia Preprocedure Evaluation (Signed)
Anesthesia Evaluation  Patient identified by MRN, date of birth, ID band Patient awake    Reviewed: Allergy & Precautions, NPO status , Patient's Chart, lab work & pertinent test results, reviewed documented beta blocker date and time   History of Anesthesia Complications Negative for: history of anesthetic complications  Airway Mallampati: II  TM Distance: >3 FB Neck ROM: Full    Dental   Pulmonary sleep apnea and Continuous Positive Airway Pressure Ventilation ,    Pulmonary exam normal        Cardiovascular hypertension, + dysrhythmias Atrial Fibrillation  Rhythm:Irregular Rate:Normal     Neuro/Psych negative neurological ROS  negative psych ROS   GI/Hepatic negative GI ROS, Neg liver ROS,   Endo/Other  diabetes, Oral Hypoglycemic Agents  Renal/GU negative Renal ROS  negative genitourinary   Musculoskeletal negative musculoskeletal ROS (+)   Abdominal   Peds  Hematology negative hematology ROS (+)   Anesthesia Other Findings   Reproductive/Obstetrics                             Anesthesia Physical Anesthesia Plan  ASA: III  Anesthesia Plan: General   Post-op Pain Management:    Induction: Intravenous  PONV Risk Score and Plan: 2 and TIVA and Treatment may vary due to age or medical condition  Airway Management Planned: Mask  Additional Equipment: None  Intra-op Plan:   Post-operative Plan:   Informed Consent: I have reviewed the patients History and Physical, chart, labs and discussed the procedure including the risks, benefits and alternatives for the proposed anesthesia with the patient or authorized representative who has indicated his/her understanding and acceptance.       Plan Discussed with:   Anesthesia Plan Comments:         Anesthesia Quick Evaluation

## 2020-07-07 NOTE — CV Procedure (Signed)
    Electrical Cardioversion Procedure Note George Hatfield [id number] [birth year]  Procedure: Electrical Cardioversion Indications:  Atrial Fibrillation  Time Out: Verified patient identification, verified procedure,medications/allergies/relevent history reviewed, required imaging and test results available.  Performed  Procedure Details  The patient was NPO after midnight. Anesthesia was administered at the beside  by Dr.Witman with propofol.  Cardioversion was performed with synchronized biphasic defibrillation via AP pads with 200 joules.  1 attempt(s) were performed.  The patient converted to normal sinus rhythm. The patient tolerated the procedure well   IMPRESSION:  Successful cardioversion of atrial fibrillation. Post ablation.     [name] 07/07/2020, 10:21 AM

## 2020-07-07 NOTE — Progress Notes (Signed)
Cardiology Office Note:    Date:  07/08/2020   ID:  KAYDON HUSBY, DOB [birth year], MRN [id number]  PCP:  Everlean Cherry, MD  Cardiologist:  Norman Herrlich, MD    Referring MD: Everlean Cherry, MD    ASSESSMENT:    1. Paroxysmal atrial fibrillation (HCC)   2. Chronic anticoagulation   3. Hypertension, essential    PLAN:    In order of problems listed above:  1. He is doing well back in sinus rhythm and hopefully 3 months out will have no recurrence in his desires to be off anticoagulation.  I told in the interim he could not stop Xarelto.  Continue beta-blocker. 2. BP at target transition ACE to ARB with chronic cough.  He has had no recurrent fluid accumulation and I think the best description is he had transient heart failure when he was in persistent atrial fibrillation with a well-preserved ejection fraction that is resolved.   Next appointment: 3 months with me   Medication Adjustments/Labs and Tests Ordered: Current medicines are reviewed at length with the patient today.  Concerns regarding medicines are outlined above.  No orders of the defined types were placed in this encounter.  No orders of the defined types were placed in this encounter.   Chief Complaint  Patient presents with  . Follow-up  . Atrial Fibrillation    History of Present Illness:    Mesiah Manzo Keeter is a 64 y.o. male with a hx of paroxysmal atrial fibrillation with cardioversion and recurrence despite an antiarrhythmic drug flecainide.  He was last seen 02/26/2020 rate controlled atrial fibrillation was continued on his anticoagulant and echocardiogram ordered as he had developed edema and shortness of breath requiring a diuretic after cardioversion.  His proBNP level was elevated moderately at 724.  He was last seen 04/07/2020.  He was referred to EP and underwent pulmonary vein isolation Dr. Hillis Range 05/29/2020.  He developed recurrent atrial fibrillation and he was cardioverted to sinus rhythm  07/07/2020. He has been seen by ENT Dr. Christia Reading for sleep apnea. Compliance with diet, lifestyle and medications: Yes  EKG yesterday after cardioversion sinus bradycardia 51 bpm otherwise normal  He feels better has a little skin irritation from the pads for cardioversion.  He has a chronic cough takes an ACE inhibitor and will transition to an ARB this should resolve his symptoms in 2 weeks he is not short of breath no edema palpitations syncope no bleeding from his anticoagulant.  He has a scheduled appointment with EP in September and he will address his desire to be off anticoagulation with Dr. Johney Frame.  Is following his rate and rhythm with a device at home.  His heart rate is relatively slow today his beta-blocker dose was increased I will put it back to 25 mg metoprolol daily.  BP is at target. Past Medical History:  Diagnosis Date  . Abnormal glucose 01/27/2016  . 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 Hyperlipidemia 01/27/2016  . Hypertension, essential 08/01/2018  . Localized swelling of both lower legs 08/01/2018  . 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 Obesity, unspecified 02/02/2016  . OSA (obstructive sleep apnea) 01/27/2016   PSG 06/01/16 AHI 11.7, uses CPAP  . Persistent atrial fibrillation (HCC) 01/25/2020  . Pre-diabetes   . 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 ATRIAL FIBRILLATION ABLATION N/A 05/29/2020   Procedure: ATRIAL FIBRILLATION ABLATION;  Surgeon: Hillis Range, MD;  Location: [facility]  INVASIVE CV LAB;  Service: Cardiovascular;  Laterality: N/A;  . KNEE ARTHROSCOPY    . VASECTOMY  1992    Current Medications: Current Meds  Medication Sig  . apixaban (ELIQUIS) 5 MG TABS tablet Take 1 tablet (5 mg total) by mouth 2 (two) times daily. (Patient taking differently: Take 5 mg by mouth 2 (two) times daily. )  . Coenzyme Q10 (CO Q 10 PO) Take 1 tablet by mouth daily.  . Cyanocobalamin (VITAMIN B-12 PO) Take 1 tablet  by mouth daily.  Marland Kitchen doxycycline (VIBRAMYCIN) 100 MG capsule Take 100 mg by mouth 2 (two) times daily.  Marland Kitchen ESTROGENS CONJUGATED PO Take 1 tablet by mouth in the morning and at bedtime. Supplement for men  . furosemide (LASIX) 20 MG tablet Take 1 tablet by mouth daily for 3 days then only as needed for weight gain/swelling (Patient taking differently: Take 20 mg by mouth daily as needed for fluid. Take 1 tablet by mouth daily for 3 days then only as needed for weight gain/swelling)  . lisinopril (PRINIVIL,ZESTRIL) 20 MG tablet Take 20 mg by mouth daily.   Marland Kitchen MAGNESIUM PO Take 1 tablet by mouth in the morning and at bedtime.   . metFORMIN (GLUCOPHAGE-XR) 500 MG 24 hr tablet Take 500 mg by mouth at bedtime.   . metoprolol succinate (TOPROL XL) 25 MG 24 hr tablet Take 1 tablet (25 mg total) by mouth in the morning and at bedtime.  . Multiple Vitamins-Minerals (ZINC PO) Take 1 tablet by mouth 3 (three) times a week. Mon. Wed fri  . Nutritional Supplements (DHEA PO) Take 1 tablet by mouth See admin instructions. Five days a week  . Omega-3 Fatty Acids (FISH OIL) 1000 MG CAPS Take 1,000 mg by mouth daily.   Marland Kitchen OVER THE COUNTER MEDICATION Take 1 tablet by mouth daily. CuraMed  . Probiotic Product (PROBIOTIC DAILY PO) Take 1 tablet by mouth daily.  . SOOLANTRA 1 % CREA Apply 1 application topically at bedtime.  . tadalafil (CIALIS) 5 MG tablet Take 5 mg by mouth daily.   Marland Kitchen VITAMIN K PO Take 1 tablet by mouth daily. Vit d3  . [DISCONTINUED] pantoprazole (PROTONIX) 40 MG tablet Take 1 tablet (40 mg total) by mouth daily.     Allergies:   Patient has no known allergies.   Social History   Socioeconomic History  . Marital status: Divorced    Spouse name: Not on file  . Number of children: Not on file  . Years of education: Not on file  . Highest education level: Not on file  Occupational History  . Not on file  Tobacco Use  . Smoking status: Never Smoker  . Smokeless tobacco: Never Used  Vaping Use    . Vaping Use: Never used  Substance and Sexual Activity  . Alcohol use: Yes    Alcohol/week: 2.0 - 3.0 standard drinks    Types: 2 - 3 Cans of beer per week  . Drug use: Never  . Sexual activity: Not on file  Other Topics Concern  . Not on file  Social History Narrative   Lives in [place] alone.  Divorced.   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 Difficulty of Paying Living Expenses:   Food Insecurity:   . Worried About Programme researcher, broadcasting/film/video in the Last Year:   . Barista in the Last Year:   Transportation Needs:   .  Lack of Transportation (Medical):   Marland Kitchen Lack of Transportation (Non-Medical):   Physical Activity:   . Days of Exercise per Week:   . Minutes of Exercise per Session:   Stress:   . Feeling of Stress :   Social Connections:   . Frequency of Communication with Friends and Family:   . Frequency of Social Gatherings with Friends and Family:   . Attends Religious Services:   . Active Member of Clubs or Organizations:   . Attends Banker Meetings:   Marland Kitchen Marital Status:      Family History: The patient's family history includes Alzheimer's disease in his father; Breast cancer in his mother; Diabetes in his father and mother; Hypertension in his mother; Stroke in his father. ROS:   Please see the history of present illness.    All other systems reviewed and are negative.  EKGs/Labs/Other Studies Reviewed:    The following studies were reviewed today:    Recent Labs: Cardioversion BMP normal 02/22/2020: NT-Pro BNP 724 07/04/2020: Platelets 132 07/07/2020: BUN 16; Creatinine, Ser 0.90; Hemoglobin 17.7; Potassium 5.2; Sodium 141  Recent Lipid Panel No results found for: CHOL, TRIG, HDL, CHOLHDL, VLDL, LDLCALC, LDLDIRECT  Physical Exam:    VS:  BP (!) 118/58   Pulse (!) 55   Ht 6\' 3"  (1.905 m)   Wt 265 lb (120.2 kg)   BMI 33.12 kg/m     Wt Readings from Last 3 Encounters:  07/08/20 265  lb (120.2 kg)  07/07/20 (!) 256 lb (116.1 kg)  06/26/20 262 lb 9.6 oz (119.1 kg)     GEN:  Well nourished, well developed in no acute distress HEENT: Normal NECK: No JVD; No carotid bruits LYMPHATICS: No lymphadenopathy CARDIAC: Regular rhythm S1 normal no murmurs, rubs, gallops RESPIRATORY:  Clear to auscultation without rales, wheezing or rhonchi  ABDOMEN: Soft, non-tender, non-distended MUSCULOSKELETAL:  No edema; No deformity  SKIN: Warm and dry NEUROLOGIC:  Alert and oriented x 3 PSYCHIATRIC:  Normal affect    Signed, 06/28/20, MD  07/08/2020 9:11 AM    [facility]

## 2020-07-08 ENCOUNTER — Encounter (HOSPITAL_COMMUNITY): Payer: Self-pay | Admitting: Cardiology

## 2020-07-08 ENCOUNTER — Ambulatory Visit: Payer: BC Managed Care – PPO | Admitting: Cardiology

## 2020-07-08 VITALS — BP 118/58 | HR 55 | Ht 75.0 in | Wt 265.0 lb

## 2020-07-08 DIAGNOSIS — Z7901 Long term (current) use of anticoagulants: Secondary | ICD-10-CM

## 2020-07-08 DIAGNOSIS — I480 Paroxysmal atrial fibrillation: Secondary | ICD-10-CM | POA: Diagnosis not present

## 2020-07-08 DIAGNOSIS — I10 Essential (primary) hypertension: Secondary | ICD-10-CM

## 2020-07-08 MED ORDER — LOSARTAN POTASSIUM 50 MG PO TABS
50.0000 mg | ORAL_TABLET | Freq: Every day | ORAL | 3 refills | Status: DC
Start: 2020-07-08 — End: 2021-06-01

## 2020-07-08 MED ORDER — METOPROLOL SUCCINATE ER 25 MG PO TB24
25.0000 mg | ORAL_TABLET | Freq: Every day | ORAL | Status: DC
Start: 2020-07-08 — End: 2020-08-27

## 2020-07-08 NOTE — Patient Instructions (Signed)
Medication Instructions:  Your physician has recommended you make the following change in your medication:  STOP: Lisinopril START: Cozaar 50 mg take one tablet by mouth daily.  REDUCE: Metoprolol to once daily.  *If you need a refill on your cardiac medications before your next appointment, please call your pharmacy*   Lab Work: None If you have labs (blood work) drawn today and your tests are completely normal, you will receive your results only by: Marland Kitchen MyChart Message (if you have MyChart) OR . A paper copy in the mail If you have any lab test that is abnormal or we need to change your treatment, we will call you to review the results.   Testing/Procedures: None   Follow-Up: At [facility], you and your health needs are our priority.  As part of our continuing mission to provide you with exceptional heart care, we have created designated Provider Care Teams.  These Care Teams include your primary Cardiologist (physician) and Advanced Practice Providers (APPs -  Physician Assistants and Nurse Practitioners) who all work together to provide you with the care you need, when you need it.  We recommend signing up for the patient portal called "MyChart".  Sign up information is provided on this After Visit Summary.  MyChart is used to connect with patients for Virtual Visits (Telemedicine).  Patients are able to view lab/test results, encounter notes, upcoming appointments, etc.  Non-urgent messages can be sent to your provider as well.   To learn more about what you can do with MyChart, go to [email or web address].    Your next appointment:   3 month(s)  The format for your next appointment:   In Person  Provider:   Norman Herrlich, MD   Other Instructions

## 2020-07-16 ENCOUNTER — Other Ambulatory Visit: Payer: Self-pay | Admitting: Internal Medicine

## 2020-08-18 ENCOUNTER — Other Ambulatory Visit: Payer: Self-pay | Admitting: Internal Medicine

## 2020-08-19 NOTE — Telephone Encounter (Signed)
Medication was marked discontinued on 07/08/20, medication is not listed on active med list. Please advise if okay to refill. KW

## 2020-08-27 ENCOUNTER — Encounter: Payer: Self-pay | Admitting: Internal Medicine

## 2020-08-27 ENCOUNTER — Ambulatory Visit: Payer: BC Managed Care – PPO | Admitting: Internal Medicine

## 2020-08-27 ENCOUNTER — Other Ambulatory Visit: Payer: Self-pay

## 2020-08-27 VITALS — BP 142/78 | HR 60 | Ht 75.0 in | Wt 257.2 lb

## 2020-08-27 DIAGNOSIS — I10 Essential (primary) hypertension: Secondary | ICD-10-CM | POA: Diagnosis not present

## 2020-08-27 DIAGNOSIS — Z7901 Long term (current) use of anticoagulants: Secondary | ICD-10-CM | POA: Insufficient documentation

## 2020-08-27 DIAGNOSIS — G4733 Obstructive sleep apnea (adult) (pediatric): Secondary | ICD-10-CM | POA: Diagnosis not present

## 2020-08-27 DIAGNOSIS — I4819 Other persistent atrial fibrillation: Secondary | ICD-10-CM | POA: Diagnosis not present

## 2020-08-27 MED ORDER — METOPROLOL SUCCINATE ER 25 MG PO TB24: 12.5000 mg | ORAL_TABLET | Freq: Every day | ORAL | 0 refills | Status: DC

## 2020-08-27 NOTE — Patient Instructions (Addendum)
Medication Instructions:  1 reduce your Toprol XL to 12.5  Take 1/2 a tablet by mouth daily. 2 Then stop in 6 weeks  *If you need a refill on your cardiac medications before your next appointment, please call your pharmacy*  Lab Work: None ordered.  If you have labs (blood work) drawn today and your tests are completely normal, you will receive your results only by:  MyChart Message (if you have MyChart) OR  A paper copy in the mail If you have any lab test that is abnormal or we need to change your treatment, we will call you to review the results.  Testing/Procedures: None ordered.  Follow-Up: At [facility], you and your health needs are our priority.  As part of our continuing mission to provide you with exceptional heart care, we have created designated Provider Care Teams.  These Care Teams include your primary Cardiologist (physician) and Advanced Practice Providers (APPs -  Physician Assistants and Nurse Practitioners) who all work together to provide you with the care you need, when you need it.  We recommend signing up for the patient portal called "MyChart".  Sign up information is provided on this After Visit Summary.  MyChart is used to connect with patients for Virtual Visits (Telemedicine).  Patients are able to view lab/test results, encounter notes, upcoming appointments, etc.  Non-urgent messages can be sent to your provider as well.   To learn more about what you can do with MyChart, go to [email or web address].    Your next appointment:   Your physician wants you to follow-up in: 01/01/20 at 9 am with Dr. Johney Frame.    Other Instructions:

## 2020-08-27 NOTE — Progress Notes (Signed)
PCP: Everlean Cherry, MD Primary Cardiologist: Dr Melina Schools George Hatfield is a 64 y.o. male who presents today for routine electrophysiology followup.  Since his recent afib ablation, the patient reports doing very well.  he denies procedure related complications and is pleased with the results of the procedure.  Today, he denies symptoms of palpitations, chest pain, shortness of breath,  lower extremity edema, dizziness, presyncope, or syncope.  The patient is otherwise without complaint today.   Past Medical History:  Diagnosis Date  . Abnormal glucose 01/27/2016  . 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 Hyperlipidemia 01/27/2016  . Hypertension, essential 08/01/2018  . Localized swelling of both lower legs 08/01/2018  . 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 Obesity, unspecified 02/02/2016  . OSA (obstructive sleep apnea) 01/27/2016   PSG 06/01/16 AHI 11.7, uses CPAP  . Persistent atrial fibrillation (HCC) 01/25/2020  . Pre-diabetes   . 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   Past Surgical History:  Procedure Laterality Date  . ATRIAL FIBRILLATION ABLATION N/A 05/29/2020   Procedure: ATRIAL FIBRILLATION ABLATION;  Surgeon: Hillis Range, MD;  Location: [facility] INVASIVE CV LAB;  Service: Cardiovascular;  Laterality: N/A;  . CARDIOVERSION N/A 07/07/2020   Procedure: CARDIOVERSION;  Surgeon: Jake Bathe, MD;  Location: [facility] ENDOSCOPY;  Service: Cardiovascular;  Laterality: N/A;  . KNEE ARTHROSCOPY    . VASECTOMY  1992    ROS- all systems are personally reviewed and negatives except as per HPI above  Current Outpatient Medications  Medication Sig Dispense Refill  . apixaban (ELIQUIS) 5 MG TABS tablet Take 1 tablet (5 mg total) by mouth 2 (two) times daily. 60 tablet 5  . Coenzyme Q10 (CO Q 10 PO) Take 1 tablet by mouth daily.    . Cyanocobalamin (VITAMIN B-12 PO) Take 1 tablet by mouth daily.    Marland Kitchen doxycycline (VIBRAMYCIN) 100 MG capsule Take 100 mg by mouth 2 (two) times daily.    Marland Kitchen ESTROGENS  CONJUGATED PO Take 1 tablet by mouth in the morning and at bedtime. Supplement for men    . furosemide (LASIX) 20 MG tablet Take 1 tablet by mouth daily for 3 days then only as needed for weight gain/swelling 15 tablet 0  . losartan (COZAAR) 50 MG tablet Take 1 tablet (50 mg total) by mouth daily. 90 tablet 3  . MAGNESIUM PO Take 1 tablet by mouth in the morning and at bedtime.     . metFORMIN (GLUCOPHAGE-XR) 500 MG 24 hr tablet Take 500 mg by mouth at bedtime.     . metoprolol succinate (TOPROL XL) 25 MG 24 hr tablet Take 1 tablet (25 mg total) by mouth daily.    . Multiple Vitamins-Minerals (ZINC PO) Take 1 tablet by mouth 3 (three) times a week. Mon. Wed fri    . Nutritional Supplements (DHEA PO) Take 1 tablet by mouth See admin instructions. Five days a week    . Omega-3 Fatty Acids (FISH OIL) 1000 MG CAPS Take 1,000 mg by mouth daily.     Marland Kitchen OVER THE COUNTER MEDICATION Take 1 tablet by mouth daily. CuraMed    . Probiotic Product (PROBIOTIC DAILY PO) Take 1 tablet by mouth daily.    . SOOLANTRA 1 % CREA Apply 1 application topically at bedtime.    . tadalafil (CIALIS) 5 MG tablet Take 5 mg by mouth daily.     Marland Kitchen VITAMIN K PO Take 1 tablet by mouth daily. Vit d3     No current facility-administered medications for  this visit.    Physical Exam: Vitals:   08/27/20 1034  BP: (!) 142/78  Pulse: 60  SpO2: 96%  Weight: 257 lb 3.2 oz (116.7 kg)  Height: 6\' 3"  (1.905 m)    GEN- The patient is well appearing, alert and oriented x 3 today.   Head- normocephalic, atraumatic Eyes-  Sclera clear, conjunctiva pink Ears- hearing intact Oropharynx- clear Lungs- Clear to ausculation bilaterally, normal work of breathing Heart- Regular rate and rhythm, no murmurs, rubs or gallops, PMI not laterally displaced GI- soft, NT, ND, + BS Extremities- no clubbing, cyanosis, or edema  EKG tracing ordered today is personally reviewed and shows sinus rhythm  Assessment and Plan:  1. Persistent atrial  fibrillation Doing well s/p ablation chads2vasc score is 2.  continue eliquis.  We discussed REACT AF or long term monitoring as potential alternatives to anticoagulation.  He has an apple watch and his following his rhythm closely. Wean toprol to off  2. HTN Stable No change required today  3. OSA Compliance with CPAP is advised He is considering Inspire  4. Obesity Body mass index is 32.15 kg/m. Lifestyle modification is advised  5. ETOH Avoidance encouraged   Return to see me in 4 months  MD, [facility] 08/27/2020 11:09 AM

## 2020-10-08 ENCOUNTER — Ambulatory Visit: Payer: BC Managed Care – PPO | Admitting: Cardiology

## 2020-10-22 NOTE — Progress Notes (Signed)
Cardiology Office Note:    Date:  10/23/2020   ID:  George Hatfield, DOB May 14, 1956, MRN [id number]  PCP:  Everlean Cherry, MD  Cardiologist:  Norman Herrlich, MD    Referring MD: Everlean Cherry, MD    ASSESSMENT:    1. Paroxysmal atrial fibrillation (HCC)   2. 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 Chronic anticoagulation    PLAN:    In order of problems listed above:  1. Stable maintaining sinus rhythm on today's EKG we will go ahead and stop his anticoagulant and monitor him with a smart watch if recurrent atrial fibrillation he will contact me and place himself back on anticoagulant 2. Stable I rechecked his blood pressure prior to leave the office 140/80 continue current treatment 3. Stop Eliquis transition aspirin   Next appointment: 6 months   Medication Adjustments/Labs and Tests Ordered: Current medicines are reviewed at length with the patient today.  Concerns regarding medicines are outlined above.  No orders of the defined types were placed in this encounter.  Meds ordered this encounter  Medications  . aspirin EC 81 MG tablet    Sig: Take 1 tablet (81 mg total) by mouth daily. Swallow whole.    Dispense:  90 tablet    Refill:  3    No chief complaint on file.   History of Present Illness:    George Hatfield is a 64 y.o. male with a hx of paroxysmal atrial fibrillation with transient diastolic heart failure and hypertensive heart disease last seen by me zero 07/08/2020 and continues follow-up with EP after ablation for paroxysmal atrial fibrillation. Compliance with diet, lifestyle and medications: Yes  George Hatfield is doing well he monitors his rhythm no breakthrough episodes of rapid heartbeat he truly wants to come off anticoagulant after discussing options benefits and risk will discontinue Eliquis continue aspirin if he gets a warning on his smart watch he will go back on his anticoagulant.  Repeat blood pressure by me 140/80 no edema shortness of  breath chest pain or palpitation. Past Medical History:  Diagnosis Date  . Abnormal glucose 01/27/2016  . 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 Hyperlipidemia 01/27/2016  . Hypertension, essential 08/01/2018  . Localized swelling of both lower legs 08/01/2018  . 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 Obesity, unspecified 02/02/2016  . OSA (obstructive sleep apnea) 01/27/2016   PSG 06/01/16 AHI 11.7, uses CPAP  . Persistent atrial fibrillation (HCC) 01/25/2020  . Pre-diabetes   . 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 ATRIAL FIBRILLATION ABLATION N/A 05/29/2020   Procedure: ATRIAL FIBRILLATION ABLATION;  Surgeon: Hillis Range, MD;  Location: [facility] INVASIVE CV LAB;  Service: Cardiovascular;  Laterality: N/A;  . CARDIOVERSION N/A 07/07/2020   Procedure: CARDIOVERSION;  Surgeon: Jake Bathe, MD;  Location: [facility] ENDOSCOPY;  Service: Cardiovascular;  Laterality: N/A;  . KNEE ARTHROSCOPY    . VASECTOMY  1992    Current Medications: Current Meds  Medication Sig  . Coenzyme Q10 (CO Q 10 PO) Take 1 tablet by mouth daily.  . Cyanocobalamin (VITAMIN B-12 PO) Take 1 tablet by mouth daily.  Marland Kitchen doxycycline (VIBRAMYCIN) 100 MG capsule Take 100 mg by mouth 2 (two) times daily.  Marland Kitchen ESTROGENS CONJUGATED PO Take 1 tablet by mouth in the morning and at bedtime. Supplement for men  . losartan (COZAAR) 50 MG tablet Take 1 tablet (50 mg total) by mouth daily.  Marland Kitchen MAGNESIUM PO  Take 1 tablet by mouth in the morning and at bedtime.   . metFORMIN (GLUCOPHAGE-XR) 500 MG 24 hr tablet Take 500 mg by mouth at bedtime.   . Multiple Vitamins-Minerals (ZINC PO) Take 1 tablet by mouth 3 (three) times a week. Mon. Wed fri  . Nutritional Supplements (DHEA PO) Take 1 tablet by mouth See admin instructions. Five days a week  . Omega-3 Fatty Acids (FISH OIL) 1000 MG CAPS Take 1,000 mg by mouth daily.   Marland Kitchen OVER THE COUNTER MEDICATION Take 1 tablet by mouth daily. CuraMed  .  Probiotic Product (PROBIOTIC DAILY PO) Take 1 tablet by mouth daily.  . SOOLANTRA 1 % CREA Apply 1 application topically at bedtime.  . tadalafil (CIALIS) 5 MG tablet Take 5 mg by mouth daily.   Marland Kitchen VITAMIN K PO Take 1 tablet by mouth daily. Vit d3  . [DISCONTINUED] apixaban (ELIQUIS) 5 MG TABS tablet Take 1 tablet (5 mg total) by mouth 2 (two) times daily.     Allergies:   Patient has no known allergies.   Social History   Socioeconomic History  . Marital status: Divorced    Spouse name: Not on file  . Number of children: Not on file  . Years of education: Not on file  . Highest education level: Not on file  Occupational History  . Not on file  Tobacco Use  . Smoking status: Never Smoker  . Smokeless tobacco: Never Used  Vaping Use  . Vaping Use: Never used  Substance and Sexual Activity  . Alcohol use: Yes    Alcohol/week: 2.0 - 3.0 standard drinks    Types: 2 - 3 Cans of beer per week  . Drug use: Never  . Sexual activity: Not on file  Other Topics Concern  . Not on file  Social History Narrative   Lives in [place] alone.  Divorced.   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 Difficulty of Paying Living Expenses: Not on file  Food Insecurity:   . Worried About Programme researcher, broadcasting/film/video in the Last Year: Not on file  . Ran Out of Food in the Last Year: Not on file  Transportation Needs:   . Lack of Transportation (Medical): Not on file  . Lack of Transportation (Non-Medical): Not on file  Physical Activity:   . Days of Exercise per Week: Not on file  . Minutes of Exercise per Session: Not on file  Stress:   . Feeling of Stress : Not on file  Social Connections:   . Frequency of Communication with Friends and Family: Not on file  . Frequency of Social Gatherings with Friends and Family: Not on file  . Attends Religious Services: Not on file  . Active Member of Clubs or Organizations: Not on file  . Attends Tax inspector Meetings: Not on file  . Marital Status: Not on file     Family History: The patient's family history includes Alzheimer's disease in his father; Breast cancer in his mother; Diabetes in his father and mother; Hypertension in his mother; Stroke in his father. ROS:   Please see the history of present illness.    All other systems reviewed and are negative.  EKGs/Labs/Other Studies Reviewed:    The following studies were reviewed today:  EKG:  EKG ordered today and personally reviewed.  The ekg ordered today demonstrates sinus rhythm normal EKG  Recent Labs: 02/22/2020:  NT-Pro BNP 724 07/04/2020: Platelets 132 07/07/2020: BUN 16; Creatinine, Ser 0.90; Hemoglobin 17.7; Potassium 5.2; Sodium 141  Recent Lipid Panel No results found for: CHOL, TRIG, HDL, CHOLHDL, VLDL, LDLCALC, LDLDIRECT  Physical Exam:    VS:  BP (!) 156/88   Pulse 75   Ht 6\' 3"  (1.905 m)   Wt 265 lb 9.6 oz (120.5 kg)   SpO2 95%   BMI 33.20 kg/m     Wt Readings from Last 3 Encounters:  10/23/20 265 lb 9.6 oz (120.5 kg)  08/27/20 257 lb 3.2 oz (116.7 kg)  07/08/20 265 lb (120.2 kg)     GEN:  Well nourished, well developed in no acute distress HEENT: Normal NECK: No JVD; No carotid bruits LYMPHATICS: No lymphadenopathy CARDIAC: RRR, no murmurs, rubs, gallops RESPIRATORY:  Clear to auscultation without rales, wheezing or rhonchi  ABDOMEN: Soft, non-tender, non-distended MUSCULOSKELETAL:  No edema; No deformity  SKIN: Warm and dry NEUROLOGIC:  Alert and oriented x 3 PSYCHIATRIC:  Normal affect    Signed, 09/07/20, MD  10/23/2020 3:28 PM    [facility]

## 2020-10-23 ENCOUNTER — Other Ambulatory Visit: Payer: Self-pay

## 2020-10-23 ENCOUNTER — Encounter: Payer: Self-pay | Admitting: Cardiology

## 2020-10-23 ENCOUNTER — Ambulatory Visit (INDEPENDENT_AMBULATORY_CARE_PROVIDER_SITE_OTHER): Payer: BC Managed Care – PPO | Admitting: Cardiology

## 2020-10-23 VITALS — BP 156/88 | HR 75 | Ht 75.0 in | Wt 265.6 lb

## 2020-10-23 DIAGNOSIS — I480 Paroxysmal atrial fibrillation: Secondary | ICD-10-CM | POA: Diagnosis not present

## 2020-10-23 DIAGNOSIS — I119 Hypertensive heart disease without heart failure: Secondary | ICD-10-CM | POA: Diagnosis not present

## 2020-10-23 DIAGNOSIS — Z7901 Long term (current) use of anticoagulants: Secondary | ICD-10-CM | POA: Diagnosis not present

## 2020-10-23 MED ORDER — ASPIRIN EC 81 MG PO TBEC
81.0000 mg | DELAYED_RELEASE_TABLET | Freq: Every day | ORAL | 3 refills | Status: AC
Start: 2020-10-23 — End: ?

## 2020-10-23 NOTE — Patient Instructions (Signed)
Medication Instructions:  Your physician has recommended you make the following change in your medication:  STOP: Eliquis START: Aspirin 81 mg take one tablet by mouth daily.  *If you need a refill on your cardiac medications before your next appointment, please call your pharmacy*   Lab Work: None If you have labs (blood work) drawn today and your tests are completely normal, you will receive your results only by: . MyChart Message (if you have MyChart) OR . A paper copy in the mail If you have any lab test that is abnormal or we need to change your treatment, we will call you to review the results.   Testing/Procedures: None   Follow-Up: At [facility], you and your health needs are our priority.  As part of our continuing mission to provide you with exceptional heart care, we have created designated Provider Care Teams.  These Care Teams include your primary Cardiologist (physician) and Advanced Practice Providers (APPs -  Physician Assistants and Nurse Practitioners) who all work together to provide you with the care you need, when you need it.  We recommend signing up for the patient portal called "MyChart".  Sign up information is provided on this After Visit Summary.  MyChart is used to connect with patients for Virtual Visits (Telemedicine).  Patients are able to view lab/test results, encounter notes, upcoming appointments, etc.  Non-urgent messages can be sent to your provider as well.   To learn more about what you can do with MyChart, go to [email or web address].    Your next appointment:   6 month(s)  The format for your next appointment:   In Person  Provider:   Brian Munley, MD   Other Instructions   

## 2020-11-14 ENCOUNTER — Other Ambulatory Visit: Payer: Self-pay | Admitting: Cardiology

## 2020-11-14 NOTE — Telephone Encounter (Signed)
Eliquis denied. Per Dr. Dulce Sellar note on 10/23/20 states patient is to stop Eliquis and transition to ASA. Pharmacy notified to d/c med.

## 2020-12-31 ENCOUNTER — Ambulatory Visit (INDEPENDENT_AMBULATORY_CARE_PROVIDER_SITE_OTHER): Payer: BC Managed Care – PPO | Admitting: Internal Medicine

## 2020-12-31 ENCOUNTER — Other Ambulatory Visit: Payer: Self-pay

## 2020-12-31 ENCOUNTER — Encounter: Payer: Self-pay | Admitting: Internal Medicine

## 2020-12-31 VITALS — BP 146/84 | HR 63 | Ht 75.0 in

## 2020-12-31 DIAGNOSIS — I10 Essential (primary) hypertension: Secondary | ICD-10-CM | POA: Diagnosis not present

## 2020-12-31 DIAGNOSIS — I4819 Other persistent atrial fibrillation: Secondary | ICD-10-CM | POA: Diagnosis not present

## 2020-12-31 DIAGNOSIS — F1010 Alcohol abuse, uncomplicated: Secondary | ICD-10-CM | POA: Diagnosis not present

## 2020-12-31 DIAGNOSIS — G4733 Obstructive sleep apnea (adult) (pediatric): Secondary | ICD-10-CM

## 2020-12-31 NOTE — Patient Instructions (Signed)
Medication Instructions:  Your physician recommends that you continue on your current medications as directed. Please refer to the Current Medication list given to you today.  Labwork: None ordered.  Testing/Procedures: None ordered.  Follow-Up: Your physician wants you to follow-up in: 9 months with    Francis Dowse, PA-C     You will receive a reminder letter in the mail two months in advance. If you don't receive a letter, please call our office to schedule the follow-up appointment.   Any Other Special Instructions Will Be Listed Below (If Applicable).  If you need a refill on your cardiac medications before your next appointment, please call your pharmacy.

## 2020-12-31 NOTE — Progress Notes (Signed)
PCP: Everlean Cherry, MD Primary Cardiologist: Dr Dulce Sellar Primary EP: Dr Verta Ellen Gittins is a 65 y.o. male who presents today for routine electrophysiology followup.  Since last being seen in our clinic, the patient reports doing very well.  Today, he denies symptoms of palpitations, chest pain, shortness of breath,  lower extremity edema, dizziness, presyncope, or syncope.  The patient is otherwise without complaint today.   Past Medical History:  Diagnosis Date  . Abnormal glucose 01/27/2016  . 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 Hyperlipidemia 01/27/2016  . Hypertension, essential 08/01/2018  . Localized swelling of both lower legs 08/01/2018  . 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 Obesity, unspecified 02/02/2016  . OSA (obstructive sleep apnea) 01/27/2016   PSG 06/01/16 AHI 11.7, uses CPAP  . Persistent atrial fibrillation (HCC) 01/25/2020  . Pre-diabetes   . 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   Past Surgical History:  Procedure Laterality Date  . ATRIAL FIBRILLATION ABLATION N/A 05/29/2020   Procedure: ATRIAL FIBRILLATION ABLATION;  Surgeon: Hillis Range, MD;  Location: [facility] INVASIVE CV LAB;  Service: Cardiovascular;  Laterality: N/A;  . CARDIOVERSION N/A 07/07/2020   Procedure: CARDIOVERSION;  Surgeon: Jake Bathe, MD;  Location: [facility] ENDOSCOPY;  Service: Cardiovascular;  Laterality: N/A;  . KNEE ARTHROSCOPY    . VASECTOMY  1992    ROS- all systems are reviewed and negatives except as per HPI above  Current Outpatient Medications  Medication Sig Dispense Refill  . aspirin EC 81 MG tablet Take 1 tablet (81 mg total) by mouth daily. Swallow whole. 90 tablet 3  . Coenzyme Q10 (CO Q 10 PO) Take 1 tablet by mouth daily.    . Cyanocobalamin (VITAMIN B-12 PO) Take 1 tablet by mouth daily.    Marland Kitchen doxycycline (VIBRAMYCIN) 100 MG capsule Take 100 mg by mouth 2 (two) times daily.    Marland Kitchen ESTROGENS CONJUGATED PO Take 1 tablet by mouth in the morning and at bedtime. Supplement for men     . MAGNESIUM PO Take 1 tablet by mouth in the morning and at bedtime.     . metFORMIN (GLUCOPHAGE-XR) 500 MG 24 hr tablet Take 500 mg by mouth at bedtime.     . Multiple Vitamins-Minerals (ZINC PO) Take 1 tablet by mouth 3 (three) times a week. Mon. Wed fri    . Nutritional Supplements (DHEA PO) Take 1 tablet by mouth See admin instructions. Five days a week    . Omega-3 Fatty Acids (FISH OIL) 1000 MG CAPS Take 1,000 mg by mouth daily.     Marland Kitchen OVER THE COUNTER MEDICATION Take 1 tablet by mouth daily. CuraMed    . Probiotic Product (PROBIOTIC DAILY PO) Take 1 tablet by mouth daily.    . SOOLANTRA 1 % CREA Apply 1 application topically at bedtime.    . tadalafil (CIALIS) 5 MG tablet Take 5 mg by mouth daily.     Marland Kitchen VITAMIN K PO Take 1 tablet by mouth daily. Vit d3    . losartan (COZAAR) 50 MG tablet Take 1 tablet (50 mg total) by mouth daily. 90 tablet 3   No current facility-administered medications for this visit.    Physical Exam: Vitals:   12/31/20 0859  BP: (!) 146/84  Pulse: 63  SpO2: 97%  Height: 6\' 3"  (1.905 m)    GEN- The patient is well appearing, alert and oriented x 3 today.   Head- normocephalic, atraumatic Eyes-  Sclera clear, conjunctiva pink Ears- hearing intact Oropharynx- clear Lungs-  Clear to ausculation bilaterally, normal work of breathing Heart- Regular rate and rhythm, no murmurs, rubs or gallops, PMI not laterally displaced GI- soft, NT, ND, + BS Extremities- no clubbing, cyanosis, or edema  Wt Readings from Last 3 Encounters:  10/23/20 265 lb 9.6 oz (120.5 kg)  08/27/20 257 lb 3.2 oz (116.7 kg)  07/08/20 265 lb (120.2 kg)    EKG tracing ordered today is personally reviewed and shows sinus rhythm 63 bpm  Assessment and Plan:  1. Persistent atrial fibrillation Doing very well post ablation off AAD therapy chads2vasc score is 2.  He was on eliquis however this we stopped by Dr Dulce Sellar.  We discussed guidelines at length today which would favor that he  stay on OAC.  AVEROES data was discussed with him today which favors eliquis over ASA for stroke prevention.   He is clear in his decision to not take OAC at this time.   2. OSA Compliant with CPAP   3. HTN Stable No change required today  4. ETOH Avoidance advised  5. Obesity Body mass index is 33.2 kg/m. Lifestyle modification is advised  Risks, benefits and potential toxicities for medications prescribed and/or refilled reviewed with patient today.   Return to see EP PA in 9 months  Hillis Range MD, [facility] 12/31/2020 9:12 AM

## 2021-02-03 IMAGING — CT CT HEART MORPH/PULM VEIN W/ CM & W/O CA SCORE
2 of 7 series · 11 of 20 positions shown, 13 images · IV contrast (Omni 300)
Comparison: None.
COMPARISON: None.

Addendum:
EXAM:
OVER-READ INTERPRETATION  CT CHEST

The following report is an over-read performed by radiologist Dr.
Deeqa Rayaan Adlaho [REDACTED] on 05/26/2020. This
over-read does not include interpretation of cardiac or coronary
anatomy or pathology. The coronary calcium score/coronary CTA
interpretation by the cardiologist is attached.
CLINICAL DATA: 63M with atrial fibrillation scheduled for an
ablation.
Cardiac CT/CTA
TECHNIQUE: The patient was scanned on a Siemens Somatom scanner.

[Series 9: 0-95% · axial · 0.41mm/px · z∈[+1334,+1473]mm · 5 of 4170 slices shown]
[im 695/4170  vessel]
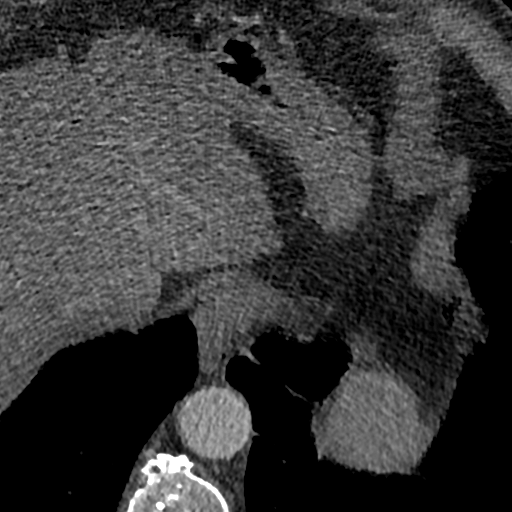
[im 1390/4170  vessel]
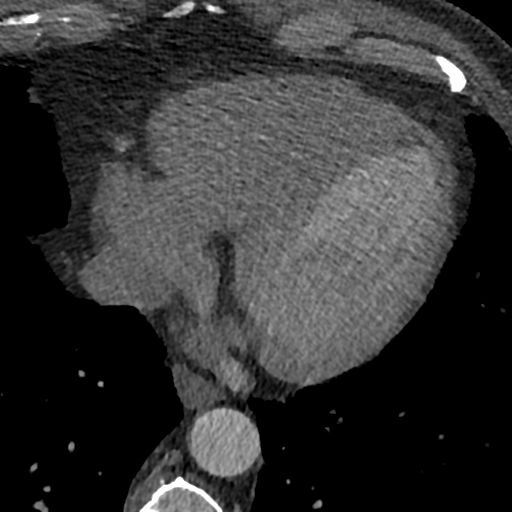
[im 2085/4170  vessel]
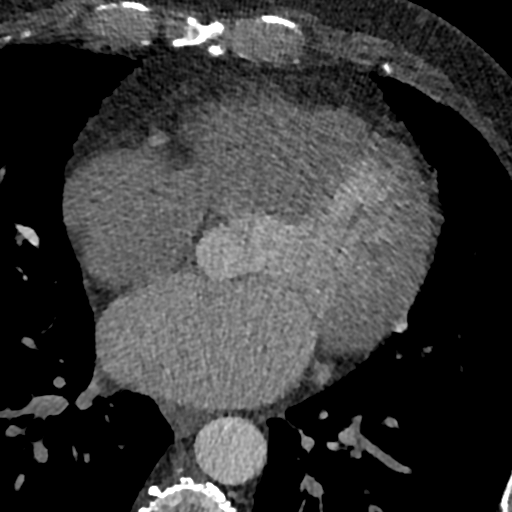
[im 2780/4170  vessel]
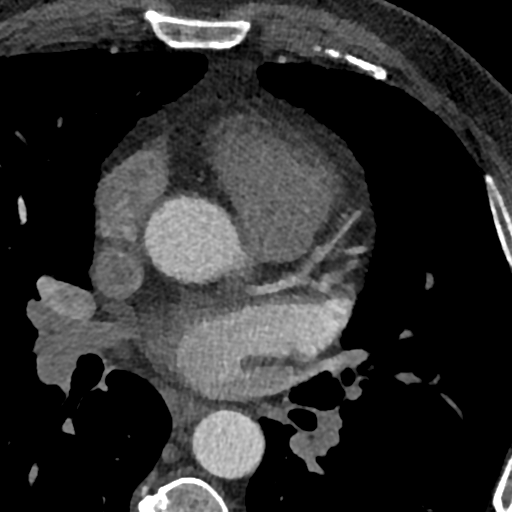
[im 3475/4170  vessel]
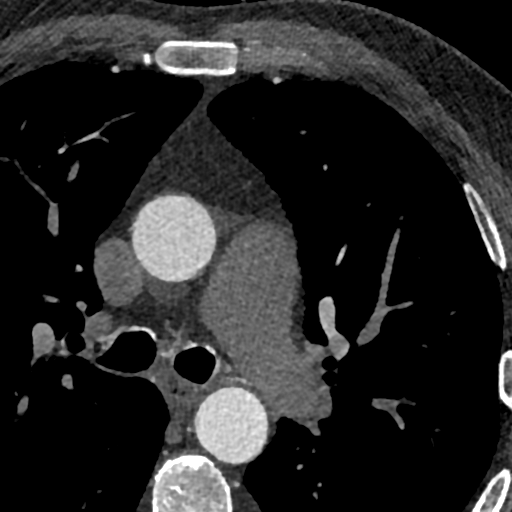

[Series 14: 5-95% · axial · 0.41mm/px · z∈[+1329,+1478]mm · 6 of 4170 slices shown, 8 images]
[im 596/4170  vessel]
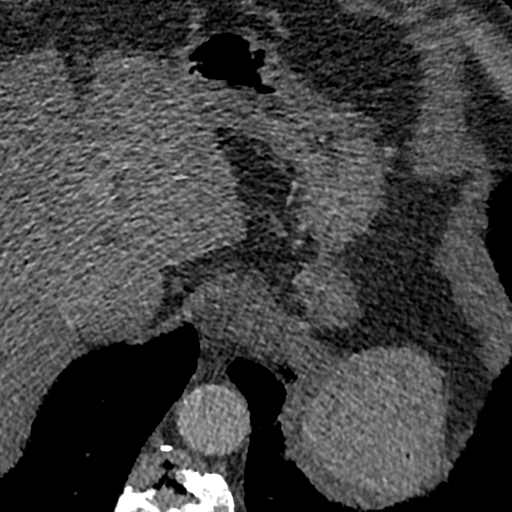
[im 596/4170  lung]
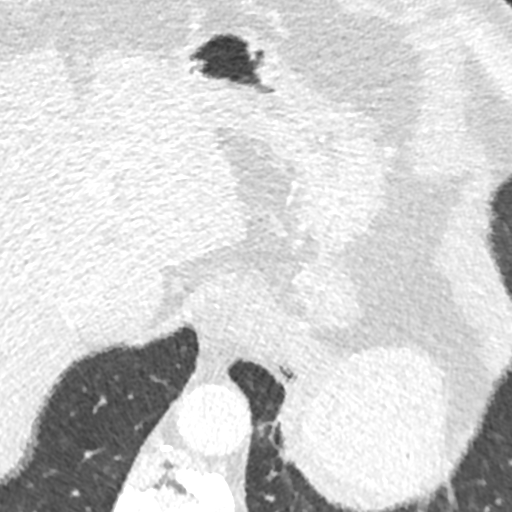
[im 1192/4170  vessel]
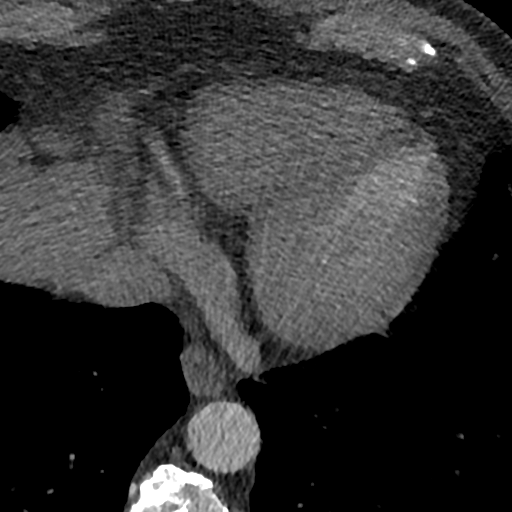
[im 1787/4170  vessel]
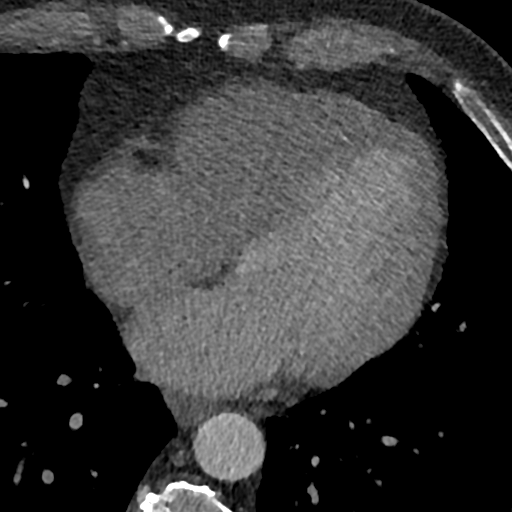
[im 2383/4170  vessel]
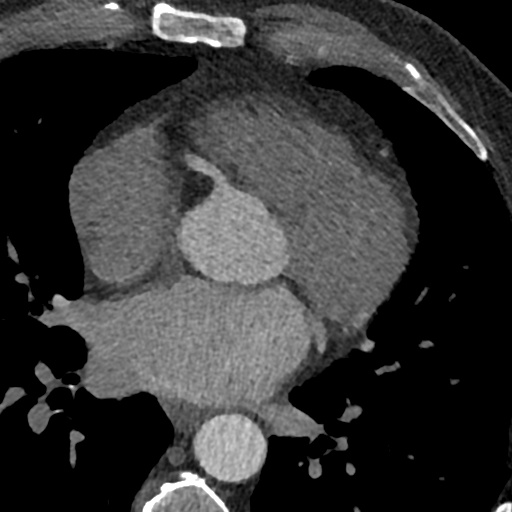
[im 2978/4170  vessel]
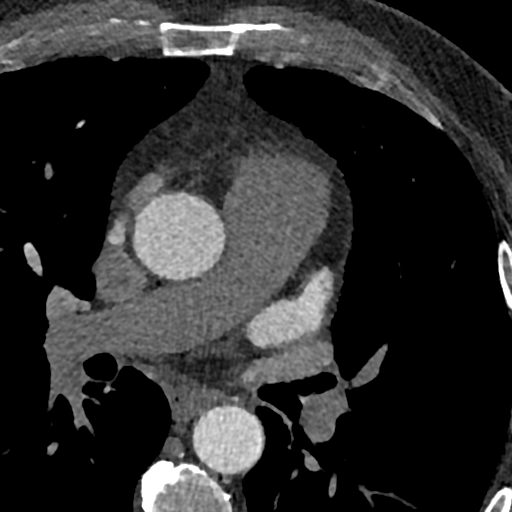
[im 2978/4170  lung]
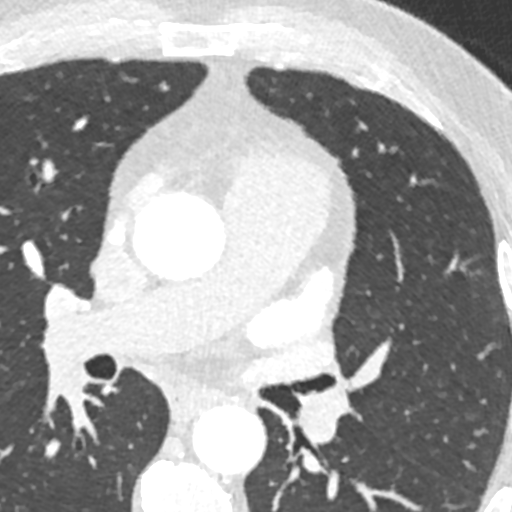
[im 3574/4170  vessel]
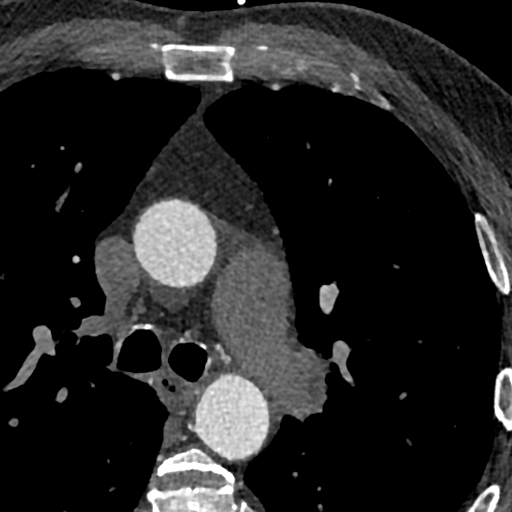

[11 of 20 positions shown; findings below may reference images not displayed]

FINDINGS: Mild scarring in the left lower lobe. Within the visualized portions
of the thorax there are no suspicious appearing pulmonary nodules or
masses, there is no acute consolidative airspace disease, no pleural
effusions, no pneumothorax and no lymphadenopathy. Visualized
portions of the upper abdomen demonstrates mild diffuse low
attenuation throughout the visualized hepatic parenchyma, indicative
of hepatic steatosis. There are no aggressive appearing lytic or
blastic lesions noted in the visualized portions of the skeleton.
IMPRESSION: 1. Hepatic steatosis.
FINDINGS: A 120 kV prospective scan was triggered in the descending thoracic
aorta at 111 HU's. Gantry rotation speed was 280 msecs and
collimation was .9 mm. No beta blockade and no NTG was given. The 3D
data set was reconstructed in 5% intervals of the 60-80 % of the R-R
cycle. Diastolic phases were analyzed on a dedicated work station
using MPR, MIP and VRT modes. The patient received 80 cc of
contrast.

There is normal pulmonary vein drainage into the left atrium (2 on
the right and 2 on the left). The left lower pulmonary vein branches
into middle and lower segments very proximally. Ostial measurements
as follows:

RUPV: 24.7 mm x 19.9 mm

RLPV: 20.7 mm x 22.2 mm

LUPV: 23.1 mm x 12.5 mm

LLPV: 19.5 mm x 11.5 mm

The left atrial appendage is large chicken wing type with two lobes
and ostial size 26 mm x 20 mm and length 37 mm. There is no thrombus
in the left atrial appendage.

The esophagus runs in the left atrial midline and is not in the
proximity to any of the pulmonary veins.

Aorta: Normal caliber. Ascending aorta 3.4 cm. No dissection or
calcifications.

Aortic Valve:  Trileaflet.  No calcifications.

Coronary Arteries: Normal coronary origin. Right dominance. The
study was performed without use of NTG and insufficient for plaque
evaluation. No significant coronary calcification noted.
IMPRESSION: 1. There is normal pulmonary vein drainage into the left atrium.

2. The left atrial appendage is large chicken wing type with two
lobes and ostial size 26 mm x 20 mm and length 37 mm. There is no
thrombus in the left atrial appendage.

3. The esophagus runs in the left atrial midline and is not in the
proximity to any of the pulmonary veins.

4. Minimal coronary artery calcification in the RCA. Coronary
calcium score 1.02, which is 57th percentile for age and gender.

*** End of Addendum ***
EXAM:
OVER-READ INTERPRETATION  CT CHEST

The following report is an over-read performed by radiologist Dr.
Deeqa Rayaan Adlaho [REDACTED] on 05/26/2020. This
over-read does not include interpretation of cardiac or coronary
anatomy or pathology. The coronary calcium score/coronary CTA
interpretation by the cardiologist is attached.
FINDINGS: Mild scarring in the left lower lobe. Within the visualized portions
of the thorax there are no suspicious appearing pulmonary nodules or
masses, there is no acute consolidative airspace disease, no pleural
effusions, no pneumothorax and no lymphadenopathy. Visualized
portions of the upper abdomen demonstrates mild diffuse low
attenuation throughout the visualized hepatic parenchyma, indicative
of hepatic steatosis. There are no aggressive appearing lytic or
blastic lesions noted in the visualized portions of the skeleton.
IMPRESSION: 1. Hepatic steatosis.

## 2021-04-08 DIAGNOSIS — R7303 Prediabetes: Secondary | ICD-10-CM | POA: Insufficient documentation

## 2021-04-22 ENCOUNTER — Other Ambulatory Visit: Payer: Self-pay

## 2021-04-22 ENCOUNTER — Ambulatory Visit (INDEPENDENT_AMBULATORY_CARE_PROVIDER_SITE_OTHER): Payer: BC Managed Care – PPO | Admitting: Cardiology

## 2021-04-22 ENCOUNTER — Encounter: Payer: Self-pay | Admitting: Cardiology

## 2021-04-22 VITALS — BP 130/80 | HR 62 | Ht 75.0 in | Wt 265.0 lb

## 2021-04-22 DIAGNOSIS — I480 Paroxysmal atrial fibrillation: Secondary | ICD-10-CM

## 2021-04-22 DIAGNOSIS — I119 Hypertensive heart disease without heart failure: Secondary | ICD-10-CM | POA: Diagnosis not present

## 2021-04-22 NOTE — Patient Instructions (Signed)

## 2021-04-22 NOTE — Progress Notes (Signed)
Cardiology Office Note:    Date:  04/22/2021   ID:  KOHEN REITHER, DOB 1956-03-21, MRN [id number]  PCP:  Everlean Cherry, MD  Cardiologist:  Norman Herrlich, MD    Referring MD: Everlean Cherry, MD    ASSESSMENT:    1. Paroxysmal atrial fibrillation (HCC)   2. Hypertensive heart disease without heart failure    PLAN:    In order of problems listed above:  1. He continues to do well after EP catheter ablation no recurrence of atrial fibrillation no longer anticoagulant 2. Blood pressure at target continue treatment ARB labs are followed with [facility]   Next appointment: 1 year   Medication Adjustments/Labs and Tests Ordered: Current medicines are reviewed at length with the patient today.  Concerns regarding medicines are outlined above.  No orders of the defined types were placed in this encounter.  No orders of the defined types were placed in this encounter.   Chief Complaint  Patient presents with  . Follow-up    History of Present Illness:    George Hatfield is a 65 y.o. male with a hx of paroxysmal atrial fibrillation maintaining sinus rhythm after EP catheter ablation, transient diastolic heart failure early in his course and hypertensive heart disease last seen 10/23/2020 and subsequently followed up with EP post atrial fibrillation ablation.. Compliance with diet, lifestyle and medications: Yes  He has an apple smart watch to track his heart rates and has had no alerts for irregularity or atrial fibrillation.  He feels well no shortness of breath edema chest pain or palpitations.  His recent A1c was 5.6%. Past Medical History:  Diagnosis Date  . Abnormal glucose 01/27/2016  . 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 Hyperlipidemia 01/27/2016  . Hypertension, essential 08/01/2018  . Localized swelling of both lower legs 08/01/2018  . 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 Obesity, unspecified 02/02/2016  . OSA (obstructive sleep apnea) 01/27/2016    PSG 06/01/16 AHI 11.7, uses CPAP  . Persistent atrial fibrillation (HCC) 01/25/2020  . Pre-diabetes   . 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 ATRIAL FIBRILLATION ABLATION N/A 05/29/2020   Procedure: ATRIAL FIBRILLATION ABLATION;  Surgeon: Hillis Range, MD;  Location: [facility];  Service: Cardiovascular;  Laterality: N/A;  . CARDIOVERSION N/A 07/07/2020   Procedure: CARDIOVERSION;  Surgeon: Jake Bathe, MD;  Location: [facility] ENDOSCOPY;  Service: Cardiovascular;  Laterality: N/A;  . KNEE ARTHROSCOPY    . VASECTOMY  1992    Current Medications: Current Meds  Medication Sig  . aspirin EC 81 MG tablet Take 1 tablet (81 mg total) by mouth daily. Swallow whole.  . Coenzyme Q10 (CO Q 10 PO) Take 1 tablet by mouth daily.  . Cyanocobalamin (VITAMIN B-12 PO) Take 1 tablet by mouth daily.  Marland Kitchen doxycycline (VIBRAMYCIN) 100 MG capsule Take 100 mg by mouth daily.  Marland Kitchen ESTROGENS CONJUGATED PO Take 1 tablet by mouth in the morning and at bedtime. Supplement for men  . losartan (COZAAR) 50 MG tablet Take 1 tablet (50 mg total) by mouth daily.  Marland Kitchen MAGNESIUM PO Take 1 tablet by mouth in the morning and at bedtime.   . metFORMIN (GLUCOPHAGE) 500 MG tablet Take 500 mg by mouth every evening.  . Omega-3 Fatty Acids (FISH OIL) 1000 MG CAPS Take 1,000 mg by mouth daily.   Marland Kitchen OVER THE COUNTER MEDICATION Take 1 tablet by mouth daily. CuraMed  . Probiotic Product (  PROBIOTIC DAILY PO) Take 1 tablet by mouth daily.  . SOOLANTRA 1 % CREA Apply 1 application topically at bedtime.  . tadalafil (CIALIS) 5 MG tablet Take 5 mg by mouth daily.   . Vitamin D-Vitamin K (VITAMIN K2-VITAMIN D3 PO) Take 1 tablet by mouth daily.  Marland Kitchen zinc gluconate 50 MG tablet Take 50 mg by mouth daily.     Allergies:   Patient has no known allergies.   Social History   Socioeconomic History  . Marital status: Divorced    Spouse name: Not on file  . Number of children: Not on file  . Years  of education: Not on file  . Highest education level: Not on file  Occupational History  . Not on file  Tobacco Use  . Smoking status: Never Smoker  . Smokeless tobacco: Never Used  Vaping Use  . Vaping Use: Never used  Substance and Sexual Activity  . Alcohol use: Yes    Alcohol/week: 2.0 - 3.0 standard drinks    Types: 2 - 3 Cans of beer per week  . Drug use: Never  . Sexual activity: Not on file  Other Topics Concern  . Not on file  Social History Narrative   Lives in [place] alone.  Divorced.   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 ROS:   Please see the history of present illness.    All other systems reviewed and are negative.  EKGs/Labs/Other Studies Reviewed:    The following studies were reviewed today:    Recent Labs: 07/04/2020: Platelets 132 07/07/2020: BUN 16; Creatinine, Ser 0.90; Hemoglobin 17.7; Potassium 5.2; Sodium 141  Recent Lipid Panel No results found for: CHOL, TRIG, HDL, CHOLHDL, VLDL, LDLCALC, LDLDIRECT  Physical Exam:    VS:  BP 130/80 (BP Location: Right Arm, Patient Position: Sitting, Cuff Size: Normal)   Pulse 62   Ht 6\' 3"  (1.905 m)   Wt 265 lb (120.2 kg)   SpO2 96%   BMI 33.12 kg/m     Wt Readings from Last 3 Encounters:  04/22/21 265 lb (120.2 kg)  10/23/20 265 lb 9.6 oz (120.5 kg)  08/27/20 257 lb 3.2 oz (116.7 kg)     GEN:  Well nourished, well developed in no acute distress HEENT: Normal NECK: No JVD; No carotid bruits LYMPHATICS: No lymphadenopathy CARDIAC: RRR, no murmurs, rubs, gallops RESPIRATORY:   Clear to auscultation without rales, wheezing or rhonchi  ABDOMEN: Soft, non-tender, non-distended MUSCULOSKELETAL:  No edema; No deformity  SKIN: Warm and dry NEUROLOGIC:  Alert and oriented x 3 PSYCHIATRIC:  Normal affect    Signed, 08/29/20, MD  04/22/2021 3:43 PM    [facility]

## 2021-05-31 ENCOUNTER — Other Ambulatory Visit: Payer: Self-pay | Admitting: Cardiology

## 2021-06-01 NOTE — Telephone Encounter (Signed)
Rx approved and sent 

## 2021-10-02 ENCOUNTER — Telehealth: Payer: Self-pay

## 2021-10-02 NOTE — Telephone Encounter (Signed)
   [facility] Pre-operative Risk Assessment    Request for surgical clearance:  What type of surgery is being performed? Left THR   When is this surgery scheduled? TBD   What type of clearance is required (medical clearance vs. Pharmacy clearance to hold med vs. Both)? Both  Are there any medications that need to be held prior to surgery and how long?Not specified however, the patient is on ASA 45m   Practice name and name of physician performing surgery? Dr[facility]    What is your office phone number: [phone]   7.   What is your office fax number: [phone] 8.   Anesthesia type (None, local, MAC, general) ? General Anesthesia    JBasil DessPrevatt 10/02/2021, 9:42 AM  _________________________________________________________________   (provider comments below)

## 2021-10-02 NOTE — Telephone Encounter (Signed)
   Name: George Hatfield  DOB: 04/25/1956  MRN: [id number]   Primary Cardiologist: Norman Herrlich, MD  Chart reviewed as part of pre-operative protocol coverage. Patient was contacted 10/02/2021 in reference to pre-operative risk assessment for pending surgery as outlined below.  George Hatfield was last seen on 04/22/21 by Dr. Dulce Sellar.  Since that day, Maria Gallicchio Shuler has done fine from a cardiac standpoint. His hip limits activity though on good days he can easily complete 4 METs without anginal complaints.  Therefore, based on ACC/AHA guidelines, the patient would be at acceptable risk for the planned procedure without further cardiovascular testing.   The patient was advised that if he develops new symptoms prior to surgery to contact our office to arrange for a follow-up visit, and he verbalized understanding.  If needed, patient can hold aspirin 7 days prior to his planned procedure and should restart when cleared to do so by his orthopedist  I will route this recommendation to the requesting party via Epic fax function and remove from pre-op pool. Please call with questions.  Beatriz Stallion, PA-C 10/02/2021, 11:01 AM

## 2022-04-13 ENCOUNTER — Encounter: Payer: Self-pay | Admitting: Cardiology

## 2022-04-23 ENCOUNTER — Ambulatory Visit (INDEPENDENT_AMBULATORY_CARE_PROVIDER_SITE_OTHER): Payer: Medicare Other | Admitting: Cardiology

## 2022-04-23 ENCOUNTER — Encounter: Payer: Self-pay | Admitting: Cardiology

## 2022-04-23 VITALS — BP 150/80 | HR 71 | Ht 75.0 in | Wt 266.6 lb

## 2022-04-23 DIAGNOSIS — G4733 Obstructive sleep apnea (adult) (pediatric): Secondary | ICD-10-CM | POA: Diagnosis not present

## 2022-04-23 DIAGNOSIS — I119 Hypertensive heart disease without heart failure: Secondary | ICD-10-CM

## 2022-04-23 DIAGNOSIS — I480 Paroxysmal atrial fibrillation: Secondary | ICD-10-CM

## 2022-04-23 NOTE — Progress Notes (Signed)
Cardiology Office Note:    Date:  04/23/2022   ID:  George Hatfield, DOB 08/20/1956, MRN [id number]  PCP:  Maris Berger, MD  Cardiologist:  Shirlee More, MD    Referring MD: Maris Berger, MD    ASSESSMENT:    1. Paroxysmal atrial fibrillation (HCC)   2. 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 Obstructive sleep apnea    PLAN:    In order of problems listed above:  He continues to do well is now 2 years remote from pulmonary vein isolation EP ablation no longer anticoagulated and self monitors with a smart watch.  He like to see me back in the future as needed and I think that would work out well Stable continue his ARB Would benefit from inspire therapy both for his atrial fibrillation hypertension controlled   Next appointment: As needed   Medication Adjustments/Labs and Tests Ordered: Current medicines are reviewed at length with the patient today.  Concerns regarding medicines are outlined above.  Orders Placed This Encounter  Procedures   EKG 12-Lead   No orders of the defined types were placed in this encounter.  Chief complaint: Follow-up for atrial fibrillation   History of Present Illness:    George Hatfield is a 66 y.o. male with a hx of paroxysmal atrial fibrillation and EP catheter ablation maintaining sinus rhythm and transient diastolic heart failure as well as hypertensive heart disease last seen 04/22/2021.  Compliance with diet, lifestyle and medications: Yes  He has severe obstructive sleep apnea and is considering inspire therapy He has an Apple Watch has had no recurrent atrial fibrillation No edema shortness of breath chest pain palpitation or syncope  Recent labs 10/19/2021 [facility] PCP Hemoglobin A1c 5.6% CMP with a potassium of 3.0 GFR 85 cc/min hemoglobin 15.8 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   Hyperlipidemia 01/27/2016    Hypertension, essential 08/01/2018   Localized swelling of both lower legs 08/01/2018   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   Obesity, unspecified 02/02/2016   OSA (obstructive sleep apnea) 01/27/2016   PSG 06/01/16 AHI 11.7, uses CPAP   Persistent atrial fibrillation ([place]) 01/25/2020   Pre-diabetes    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    Past Surgical History:  Procedure Laterality Date   ATRIAL FIBRILLATION ABLATION N/A 05/29/2020   Procedure: ATRIAL FIBRILLATION ABLATION;  Surgeon: Thompson Grayer, MD;  Location: [place] CV [facility];  Service: Cardiovascular;  Laterality: N/A;   CARDIOVERSION N/A 07/07/2020   Procedure: CARDIOVERSION;  Surgeon: Jerline Pain, MD;  Location: [facility] ENDOSCOPY;  Service: Cardiovascular;  Laterality: N/A;   KNEE ARTHROSCOPY     VASECTOMY  1992    Current Medications: Current Meds  Medication Sig   aspirin EC 81 MG tablet Take 1 tablet (81 mg total) by mouth daily. Swallow whole.   losartan (COZAAR) 50 MG tablet Take 1 tablet (50 mg total) by mouth daily.   metFORMIN (GLUCOPHAGE) 500 MG tablet Take 500 mg by mouth every evening.   tadalafil (CIALIS) 5 MG tablet Take 5 mg by mouth daily.      Allergies:   Patient has no known allergies.   Social History   Socioeconomic History   Marital status: Divorced    Spouse name: Not on file   Number of children: Not on file   Years of education: Not on file   Highest education level: Not on file  Occupational  History   Not on file  Tobacco Use   Smoking status: Never    Passive exposure: Never   Smokeless tobacco: Never  Vaping Use   Vaping Use: Never used  Substance and Sexual Activity   Alcohol use: Yes    Alcohol/week: 2.0 - 3.0 standard drinks    Types: 2 - 3 Cans of beer per week   Drug use: Never   Sexual activity: Not on file  Other Topics Concern   Not on file  Social History Narrative   Lives in [place] alone.  Divorced.   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 ROS:   Please see the history of present illness.    All other systems reviewed and are negative.  EKGs/Labs/Other Studies Reviewed:    The following studies were reviewed today:  EKG:  EKG ordered today and personally reviewed.  The ekg ordered today demonstrates sinus rhythm nonspecific conduction delay    Physical Exam:    VS:  BP (!) 150/80 (BP Location: Right Arm, Patient Position: Sitting)   Pulse 71   Ht 6\' 3"  (1.905 m)   Wt 266 lb 9.6 oz (120.9 kg)   SpO2 98%   BMI 33.32 kg/m     Wt Readings from Last 3 Encounters:  04/23/22 266 lb 9.6 oz (120.9 kg)  04/22/21 265 lb (120.2 kg)  10/23/20 265 lb 9.6 oz (120.5 kg)     GEN:  Well nourished, well developed in no acute distress HEENT: Normal NECK: No JVD; No carotid bruits LYMPHATICS: No lymphadenopathy CARDIAC: RRR, no murmurs, rubs, gallops RESPIRATORY:  Clear to auscultation without rales, wheezing or rhonchi  ABDOMEN: Soft, non-tender, non-distended MUSCULOSKELETAL:  No edema; No deformity  SKIN: Warm and dry NEUROLOGIC:  Alert and oriented x 3 PSYCHIATRIC:  Normal affect    Signed, Shirlee More, MD  04/23/2022 11:54 AM    [place]

## 2022-04-23 NOTE — Patient Instructions (Signed)
Medication Instructions:  ?Your physician recommends that you continue on your current medications as directed. Please refer to the Current Medication list given to you today. ? ?*If you need a refill on your cardiac medications before your next appointment, please call your pharmacy* ? ? ?Lab Work: ?None ?If you have labs (blood work) drawn today and your tests are completely normal, you will receive your results only by: ?MyChart Message (if you have MyChart) OR ?A paper copy in the mail ?If you have any lab test that is abnormal or we need to change your treatment, we will call you to review the results. ? ? ?Testing/Procedures: ?None ? ? ?Follow-Up: ?At [facility], you and your health needs are our priority.  As part of our continuing mission to provide you with exceptional heart care, we have created designated Provider Care Teams.  These Care Teams include your primary Cardiologist (physician) and Advanced Practice Providers (APPs -  Physician Assistants and Nurse Practitioners) who all work together to provide you with the care you need, when you need it. ? ?We recommend signing up for the patient portal called "MyChart".  Sign up information is provided on this After Visit Summary.  MyChart is used to connect with patients for Virtual Visits (Telemedicine).  Patients are able to view lab/test results, encounter notes, upcoming appointments, etc.  Non-urgent messages can be sent to your provider as well.   ?To learn more about what you can do with MyChart, go to [email or web address].   ? ?Your next appointment:   ?Follow up as needed. ? ?The format for your next appointment:   ?In Person ? ?Provider:   ?Brian Munley, MD{ ? ? ?Other Instructions ?None ? ?Important Information About Sugar ? ? ? ? ? ? ?

## 2022-05-04 ENCOUNTER — Other Ambulatory Visit: Payer: Self-pay | Admitting: Cardiology

## 2024-01-25 ENCOUNTER — Ambulatory Visit (HOSPITAL_BASED_OUTPATIENT_CLINIC_OR_DEPARTMENT_OTHER)
Admission: EM | Admit: 2024-01-25 | Discharge: 2024-01-25 | Disposition: A | Discharge status: Home / Self Care | Payer: Medicare HMO

## 2024-01-25 ENCOUNTER — Encounter (HOSPITAL_BASED_OUTPATIENT_CLINIC_OR_DEPARTMENT_OTHER): Payer: Self-pay

## 2024-01-25 DIAGNOSIS — S61412A Laceration without foreign body of left hand, initial encounter: Secondary | ICD-10-CM | POA: Diagnosis not present

## 2024-01-25 DIAGNOSIS — Z23 Encounter for immunization: Secondary | ICD-10-CM

## 2024-01-25 MED ORDER — TETANUS-DIPHTH-ACELL PERTUSSIS 5-2.5-18.5 LF-MCG/0.5 IM SUSY
0.5000 mL | PREFILLED_SYRINGE | Freq: Once | INTRAMUSCULAR | Status: AC
  Administered 2024-01-25: 0.5 mL via INTRAMUSCULAR

## 2024-01-25 NOTE — Discharge Instructions (Signed)
 We placed 5 sutures today.  We updated your tetanus today.  Please return in 10 to 14 days to have these removed.  Keep the area clean if you have any signs of infection including redness, pain, swelling, drainage you should return immediately for reevaluation.

## 2024-01-25 NOTE — ED Triage Notes (Signed)
 Laceration to palm side of left hand. Dressing clean and intact on arrival. Dressing removed. Deep laceration approx 1 1/4 inches long. Pulls together well. Bleeding controlled.Last tetanus 2016.

## 2024-01-25 NOTE — ED Provider Notes (Signed)
 [facility]    CSN: [id number] Arrival date & time: 01/25/24  1357      History   Chief Complaint Chief Complaint  Patient presents with   Laceration    HPI [facility]field is a 68 y.o. male.   Patient presents today with a several hour history of laceration to his left palm.  Reports that he was using a razor knife to cut a threshold while doing home renovations when this slipped and cut his palm.  He is confident that there is no retained foreign body.  He is right-handed.  He is able to move all of his fingers and denies any numbness or paresthesias.  He cleaned the area with soap and water and applied a dressing before presenting to our clinic.  His last tetanus was in 2016.    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    Patient Active Problem List   Diagnosis Date Noted   Pre-diabetes    Chronic anticoagulation 08/27/2020   Persistent atrial fibrillation (HCC) 01/25/2020   Chronic atrial fibrillation (HCC) 01/01/2020   Controlled type 2 diabetes mellitus without complication, without long-term current use of insulin (HCC) 02/14/2019   Diastolic dysfunction 12/15/2018   LVH (left ventricular hypertrophy) due to hypertensive disease, without heart failure 12/15/2018   Statin declined 12/15/2018   SOB (shortness of breath) 08/08/2018   Hypertensive heart disease 08/01/2018   Localized swelling of both lower legs 08/01/2018   Hypertension, essential 08/01/2018   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   Male hypogonadism  02/02/2016   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   Obesity, unspecified 02/02/2016   Abnormal glucose 01/27/2016   Hyperlipidemia 01/27/2016   OSA (obstructive sleep apnea) 01/27/2016    Past Surgical History:  Procedure Laterality Date   ATRIAL FIBRILLATION ABLATION N/A 05/29/2020   Procedure: ATRIAL FIBRILLATION ABLATION;  Surgeon: Hillis Range, MD;  Location: [facility];  Service: Cardiovascular;  Laterality: N/A;   CARDIOVERSION N/A 07/07/2020   Procedure: CARDIOVERSION;  Surgeon: Jake Bathe, MD;  Location: [facility];  Service: Cardiovascular;  Laterality: N/A;   KNEE ARTHROSCOPY     VASECTOMY  1992       Home Medications    Prior to Admission medications   Medication Sig Start Date End Date Taking? Authorizing Provider  sildenafil (VIAGRA) 25 MG tablet Take 100 mg by mouth daily as needed for erectile dysfunction.   Yes [provider]  aspirin EC 81 MG tablet Take 1 tablet (81 mg total) by mouth daily. Swallow whole. 10/23/20   Baldo Daub, MD  losartan (COZAAR) 50 MG tablet Take 1 tablet (50 mg total) by mouth daily. 05/05/22   Baldo Daub, MD  metFORMIN (GLUCOPHAGE) 500 MG tablet Take 500 mg by mouth every evening. 02/12/21   [provider]  tadalafil (CIALIS) 5 MG tablet Take 5 mg by mouth daily.  11/20/19   [provider]    Family History Family History  Problem Relation Age of Onset   Breast cancer Mother    Diabetes Mother  Hypertension Mother    Alzheimer's disease Father    Diabetes Father    Stroke Father     Social History Social History   Tobacco Use   Smoking status: Never    Passive exposure: Never   Smokeless tobacco: Never  Vaping Use   Vaping status: Never Used  Substance Use Topics   Alcohol use: Yes    Alcohol/week: 2.0 - 3.0 standard drinks of alcohol    Types: 2 - 3 Cans of beer per week   Drug use: Never     Allergies   Patient has no known allergies.   Review of Systems Review of  Systems  Constitutional:  Negative for activity change, appetite change, fatigue and fever.  Musculoskeletal:  Negative for arthralgias and myalgias.  Skin:  Positive for wound.  Neurological:  Negative for weakness and numbness.     Physical Exam Triage Vital Signs ED Triage Vitals  Encounter Vitals Group     BP 01/25/24 1410 (!) 145/82     Systolic BP Percentile --      Diastolic BP Percentile --      Pulse Rate 01/25/24 1410 70     Resp 01/25/24 1410 20     Temp 01/25/24 1410 98.6 F (37 C)     Temp Source 01/25/24 1410 Oral     SpO2 01/25/24 1410 96 %     Weight --      Height --      Head Circumference --      Peak Flow --      Pain Score 01/25/24 1413 1     Pain Loc --      Pain Education --      Exclude from Growth Chart --    No data found.  Updated Vital Signs BP (!) 145/82 (BP Location: Right Arm)   Pulse 70   Temp 98.6 F (37 C) (Oral)   Resp 20   SpO2 96%   Visual Acuity Right Eye Distance:   Left Eye Distance:   Bilateral Distance:    Right Eye Near:   Left Eye Near:    Bilateral Near:     Physical Exam Vitals reviewed.  Constitutional:      General: He is awake.     Appearance: Normal appearance. He is well-developed. He is not ill-appearing.     Comments: Very pleasant male appears stated age in no acute distress sitting comfortably in exam room  HENT:     Head: Normocephalic and atraumatic.  Cardiovascular:     Comments: Capillary refill within 2 seconds left fingers Pulmonary:     Effort: Pulmonary effort is normal. No accessory muscle usage or respiratory distress.  Musculoskeletal:     Left hand: Laceration present. No swelling or tenderness. Normal range of motion. There is no disruption of two-point discrimination. Normal capillary refill.       Arms:     Comments: 4 cm laceration to left palm distal to thenar eminence without active bleeding.  Hand is neurovascular intact with normal active range of motion of fingers and wrist   Neurological:     Mental Status: He is alert.  Psychiatric:        Behavior: Behavior is cooperative.      UC Treatments / Results  Labs (all labs ordered are listed, but only abnormal results are displayed) Labs Reviewed - No data to display  EKG   Radiology No results found.  Procedures Laceration Repair  Date/Time: 01/25/2024  2:56 PM  Performed by: Jeani Hawking, PA-C Authorized by: Jeani Hawking, PA-C   Consent:    Consent obtained:  Verbal   Consent given by:  Patient   Risks discussed:  Poor wound healing, poor cosmetic result, pain and infection Universal protocol:    Procedure explained and questions answered to patient or proxy's satisfaction: yes     Patient identity confirmed:  Verbally with patient Anesthesia:    Anesthesia method:  Local infiltration   Local anesthetic:  Lidocaine 1% w/o epi Laceration details:    Location:  Hand   Hand location:  L palm   Length (cm):  4 Pre-procedure details:    Preparation:  Patient was prepped and draped in usual sterile fashion Exploration:    Hemostasis achieved with:  Direct pressure   Wound exploration: entire depth of wound visualized   Treatment:    Area cleansed with:  Chlorhexidine   Amount of cleaning:  Standard   Irrigation solution:  Tap water   Irrigation method:  Syringe   Debridement:  None   Undermining:  None   Scar revision: no   Skin repair:    Repair method:  Sutures   Suture size:  5-0   Suture material:  Prolene   Suture technique:  Simple interrupted   Number of sutures:  5 Approximation:    Approximation:  Close Repair type:    Repair type:  Simple Post-procedure details:    Dressing:  Non-adherent dressing   Procedure completion:  Tolerated  (including critical care time)  Medications Ordered in UC Medications  Tdap (BOOSTRIX) injection 0.5 mL (0.5 mLs Intramuscular Given 01/25/24 1450)    Initial Impression / Assessment and Plan / UC Course  I have reviewed the  triage vital signs and the nursing notes.  Pertinent labs & imaging results that were available during my care of the patient were reviewed by me and considered in my medical decision making (see chart for details).     Patient is well-appearing, afebrile, nontoxic, nontachycardic.  Laceration noted left palmar surface.  This was closed with 5 simple interrupted sutures.  Tetanus was updated.  He is to return in 10 to 14 days to have sutures removed.  No indication for prophylactic antibiotics.  We did discuss signs/symptoms of infection that would warrant reevaluation.  Discussed that if anything worsens or changes he should return for reevaluation.  Strict return precautions given.  All questions answered to patient satisfaction.  Final Clinical Impressions(s) / UC Diagnoses   Final diagnoses:  Laceration of left palm, initial encounter     Discharge Instructions      We placed 5 sutures today.  We updated your tetanus today.  Please return in 10 to 14 days to have these removed.  Keep the area clean if you have any signs of infection including redness, pain, swelling, drainage you should return immediately for reevaluation.     ED Prescriptions   None    PDMP not reviewed this encounter.   Jeani Hawking, PA-C 01/25/24 1503

## 2024-06-22 ENCOUNTER — Ambulatory Visit (HOSPITAL_BASED_OUTPATIENT_CLINIC_OR_DEPARTMENT_OTHER)
Admit: 2024-06-22 | Discharge: 2024-06-22 | Disposition: A | Discharge status: Home / Self Care | Attending: Family Medicine | Admitting: Family Medicine

## 2024-06-22 ENCOUNTER — Encounter (HOSPITAL_BASED_OUTPATIENT_CLINIC_OR_DEPARTMENT_OTHER): Payer: Self-pay

## 2024-06-22 ENCOUNTER — Ambulatory Visit (HOSPITAL_BASED_OUTPATIENT_CLINIC_OR_DEPARTMENT_OTHER)
Admission: RE | Admit: 2024-06-22 | Discharge: 2024-06-22 | Disposition: A | Discharge status: Home / Self Care | Source: Ambulatory Visit

## 2024-06-22 VITALS — BP 170/81 | HR 65 | Temp 98.5°F | Resp 20

## 2024-06-22 DIAGNOSIS — M795 Residual foreign body in soft tissue: Secondary | ICD-10-CM

## 2024-06-22 NOTE — ED Triage Notes (Signed)
 Splinter to tip of right index finger x 1.5 months. States had a small piece removed but still more in finger tip.

## 2024-06-22 NOTE — Discharge Instructions (Signed)
 Go home and soak the finger. Hopefully with the opening now the foreign body will come out. Keep clean. See hand specialist if needed. You can call [facility] if needed.

## 2024-06-22 NOTE — ED Provider Notes (Signed)
 [name]    CSN: [id number] Arrival date & time: 06/22/24  1551      History   Chief Complaint Chief Complaint  Patient presents with   Foreign Body in Skin    HPI [facility] is a 68 y.o. male.   Pt is a 68 year old male that presents with pain in the right index finger. Splinter to tip of right index finger x 1.5 months. States had a small piece removed but still more in finger tip.     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    Patient Active Problem List   Diagnosis Date Noted   Pre-diabetes    Chronic anticoagulation 08/27/2020   Persistent atrial fibrillation (HCC) 01/25/2020   Chronic atrial fibrillation (HCC) 01/01/2020   Controlled type 2 diabetes mellitus without complication, without long-term current use of insulin (HCC) 02/14/2019   Diastolic dysfunction 12/15/2018   LVH (left ventricular hypertrophy) due to hypertensive disease, without heart failure 12/15/2018   Statin declined 12/15/2018   SOB (shortness of breath) 08/08/2018   Hypertensive heart disease 08/01/2018   Localized swelling of both lower legs 08/01/2018   Hypertension, essential 08/01/2018   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   Obesity, unspecified 02/02/2016   Abnormal glucose 01/27/2016   Hyperlipidemia 01/27/2016   OSA (obstructive sleep apnea) 01/27/2016    Past Surgical History:  Procedure Laterality Date   ATRIAL FIBRILLATION ABLATION N/A 05/29/2020    Procedure: ATRIAL FIBRILLATION ABLATION;  Surgeon: Kelsie Lynwood, MD;  Location: [facility];  Service: Cardiovascular;  Laterality: N/A;   CARDIOVERSION N/A 07/07/2020   Procedure: CARDIOVERSION;  Surgeon: Jeffrie Oneil BROCKS, MD;  Location: [facility];  Service: Cardiovascular;  Laterality: N/A;   KNEE ARTHROSCOPY     VASECTOMY  1992       Home Medications    Prior to Admission medications   Medication Sig Start Date End Date Taking? Authorizing Provider  amLODipine (NORVASC) 10 MG tablet Take 10 mg by mouth daily.   Yes [provider]  aspirin  EC 81 MG tablet Take 1 tablet (81 mg total) by mouth daily. Swallow whole. 10/23/20   Monetta Redell PARAS, MD  losartan  (COZAAR ) 50 MG tablet Take 1 tablet (50 mg total) by mouth daily. 05/05/22   Monetta Redell PARAS, MD  metFORMIN (GLUCOPHAGE) 500 MG tablet Take 500 mg by mouth every evening. 02/12/21   [provider]  sildenafil (VIAGRA) 25 MG tablet Take 100 mg by mouth daily as needed for erectile dysfunction.    [provider]  tadalafil (CIALIS) 5 MG tablet Take 5 mg by mouth daily.  11/20/19   [provider]    Family History Family History  Problem Relation Age of Onset   Breast cancer Mother    Diabetes Mother    Hypertension Mother    Alzheimer's disease Father    Diabetes Father    Stroke Father     Social History Social History   Tobacco Use   Smoking  status: Never    Passive exposure: Never   Smokeless tobacco: Never  Vaping Use   Vaping status: Never Used  Substance Use Topics   Alcohol use: Yes    Alcohol/week: 2.0 - 3.0 standard drinks of alcohol    Types: 2 - 3 Cans of beer per week   Drug use: Never     Allergies   Patient has no known allergies.   Review of Systems Review of Systems  See HPI Physical Exam Triage Vital Signs ED Triage Vitals  Encounter Vitals Group     BP 06/22/24 1558 (!) 170/81     Girls Systolic BP Percentile --      Girls Diastolic BP  Percentile --      Boys Systolic BP Percentile --      Boys Diastolic BP Percentile --      Pulse Rate 06/22/24 1558 65     Resp 06/22/24 1558 20     Temp 06/22/24 1558 98.5 F (36.9 C)     Temp Source 06/22/24 1558 Oral     SpO2 06/22/24 1558 96 %     Weight --      Height --      Head Circumference --      Peak Flow --      Pain Score 06/22/24 1559 6     Pain Loc --      Pain Education --      Exclude from Growth Chart --    No data found.  Updated Vital Signs BP (!) 170/81 (BP Location: Right Arm)   Pulse 65   Temp 98.5 F (36.9 C) (Oral)   Resp 20   SpO2 96%   Visual Acuity Right Eye Distance:   Left Eye Distance:   Bilateral Distance:    Right Eye Near:   Left Eye Near:    Bilateral Near:     Physical Exam Constitutional:      Appearance: Normal appearance.  Pulmonary:     Effort: Pulmonary effort is normal.  Musculoskeletal:        General: Normal range of motion.  Skin:    General: Skin is warm and dry.     Comments: Small callous to the tip of right index finger. Tender with deep palpation.   Neurological:     Mental Status: He is alert.  Psychiatric:        Mood and Affect: Mood normal.      UC Treatments / Results  Labs (all labs ordered are listed, but only abnormal results are displayed) Labs Reviewed - No data to display  EKG   Radiology DG Finger Index Right Result Date: 06/22/2024 CLINICAL DATA:  Provided history: Foreign body in soft tissue. Technologist notes state splinter in tip of index finger for 1-1/2 months. EXAM: RIGHT INDEX FINGER 2+V COMPARISON:  None Available. FINDINGS: There is no evidence of fracture or dislocation. There is mild degenerative spurring, most prominently affecting the distal interphalangeal joint where there is fragmented spurring. No erosive change or periostitis. No visible radiopaque foreign body. No soft tissue gas. IMPRESSION: 1. No visible radiopaque foreign body. Please note that wood is not  necessarily radiopaque. Ultrasound could be considered based on clinical concern. 2. Mild degenerative spurring, most prominently affecting the distal interphalangeal joint. Electronically Signed   By: Andrea Gasman M.D.   On: 06/22/2024 16:35    Procedures Procedures (including critical care time)  Medications Ordered in UC Medications - No data to display  Initial Impression / Assessment and Plan / UC Course  I have reviewed the triage vital signs and the nursing notes.  Pertinent labs & imaging results that were available during my care of the patient were reviewed by me and considered in my medical decision making (see chart for details).     Foreign body in finger. Attempted to explore the area here today after numbing the finger. Made small opening. Was not able to remove any foreign body. Recommend go home, warm soaks and massage the area. Keep clean. Call hand specialist for continued issues.  Final Clinical Impressions(s) / UC Diagnoses   Final diagnoses:  Foreign body (FB) in soft tissue     Discharge Instructions      Go home and soak the finger. Hopefully with the opening now the foreign body will come out. Keep clean. See hand specialist if needed. You can call [facility] if needed.     ED Prescriptions   None    PDMP not reviewed this encounter.   Adah Wilbert LABOR, FNP 06/22/24 [phone]

## 2024-12-11 ENCOUNTER — Ambulatory Visit (HOSPITAL_BASED_OUTPATIENT_CLINIC_OR_DEPARTMENT_OTHER): Payer: Self-pay | Admitting: Family Medicine

## 2024-12-11 ENCOUNTER — Ambulatory Visit (HOSPITAL_BASED_OUTPATIENT_CLINIC_OR_DEPARTMENT_OTHER)
Admission: RE | Admit: 2024-12-11 | Discharge: 2024-12-11 | Disposition: A | Discharge status: Home / Self Care | Source: Ambulatory Visit | Attending: Family Medicine | Admitting: Family Medicine

## 2024-12-11 ENCOUNTER — Ambulatory Visit (INDEPENDENT_AMBULATORY_CARE_PROVIDER_SITE_OTHER): Admit: 2024-12-11 | Discharge: 2024-12-11 | Disposition: A | Discharge status: Home / Self Care | Admitting: Radiology

## 2024-12-11 ENCOUNTER — Encounter (HOSPITAL_BASED_OUTPATIENT_CLINIC_OR_DEPARTMENT_OTHER): Payer: Self-pay

## 2024-12-11 VITALS — BP 143/83 | HR 75 | Temp 97.1°F | Resp 16

## 2024-12-11 DIAGNOSIS — S90212A Contusion of left great toe with damage to nail, initial encounter: Secondary | ICD-10-CM | POA: Diagnosis not present

## 2024-12-11 DIAGNOSIS — M79672 Pain in left foot: Secondary | ICD-10-CM

## 2024-12-11 DIAGNOSIS — M79675 Pain in left toe(s): Secondary | ICD-10-CM | POA: Diagnosis not present

## 2024-12-11 MED ORDER — MUPIROCIN 2 % EX OINT: 1.0000 | TOPICAL_OINTMENT | Freq: Two times a day (BID) | CUTANEOUS | 0 refills | Status: AC

## 2024-12-11 NOTE — Progress Notes (Signed)
 Fractures noted on the left toe at the proximal phalanx near the metatarsal.  I had shown these to the patient on his x-ray at the time of discharge and encouraged a postop shoe but he did not have any pain in the area and did not want to use a postop shoe.  I have updated him via voicemail and encouraged use of a postop shoe or even to see orthopedics or podiatry.  I encouraged patient to call back if he needs further assistance with securing a postop shoe or getting connected with a podiatrist or orthopedics.

## 2024-12-11 NOTE — Discharge Instructions (Addendum)
 Crush injury/contusion to the left great toe/toenail with left foot pain and great toe pain: X-ray appears to show some arthritic changes and possibly small avulsion chip on the distal first metatarsal.  However the patient does not have bruising or pain there.  Will update the patient once radiology has reviewed the films  Patient is walking with minimal pain and declined a postop shoe.  Encouraged good hard shoes.  Use acetaminophen  or ibuprofen, OTC, as needed for pain.  Encouraged ice and elevation.  Try to keep the left great toe dry.  May wash in the shower but take the bandages off first.  Do not soak in any kind of tub or bath.  Clean with warm soapy fingers, rinse, pat dry, apply mupirocin  ointment and use a bandage if needed.  The nail is injured and is likely to fall off at some point in the future.  Follow-up with primary care, podiatry or return here if symptoms do not improve, worsen or new symptoms occur.

## 2024-12-11 NOTE — ED Triage Notes (Signed)
 PT c/o left foot and toe pain x 2 day. Patient states he drop a 10-pound weight on his foot. No medication have been taken at this time.

## 2024-12-11 NOTE — ED Provider Notes (Signed)
 " [name]    CSN: [id number] Arrival date & time: 12/11/24  9057      History   Chief Complaint Chief Complaint  Patient presents with   Foot Injury    Dropped a 10 weight on my big toe of my left foot - Entered by patient    HPI George Hatfield is a 69 y.o. male.   69 year old male who dropped a 10 pound weight on his left foot on 12/09/2024.  The injury is mostly on his left great toe but there is some bruising at the base of the toe and the distal foot.  The nail is loose at the nailbed but still attached.  There is some bleeding at the site that is mild.  It is very sore and tender and hurts when he walks.  He is on aspirin  81 mg daily for heart health and for previous history of A-fib.  He has had an ablation and has not had A-fib in quite some time but still stays on the aspirin .   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    Patient Active Problem List   Diagnosis Date Noted   Pre-diabetes    Chronic anticoagulation 08/27/2020   Persistent atrial fibrillation (HCC) 01/25/2020   Chronic atrial fibrillation (HCC) 01/01/2020   Controlled type 2 diabetes mellitus without complication, without long-term current use of insulin (HCC) 02/14/2019   Diastolic dysfunction 12/15/2018   LVH (left ventricular hypertrophy) due to hypertensive disease, without heart failure 12/15/2018   Statin declined 12/15/2018   SOB (shortness of breath) 08/08/2018   Hypertensive heart disease  08/01/2018   Localized swelling of both lower legs 08/01/2018   Hypertension, essential 08/01/2018   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   Obesity, unspecified 02/02/2016   Abnormal glucose 01/27/2016   Hyperlipidemia 01/27/2016   OSA (obstructive sleep apnea) 01/27/2016    Past Surgical History:  Procedure Laterality Date   ATRIAL FIBRILLATION ABLATION N/A 05/29/2020   Procedure: ATRIAL FIBRILLATION ABLATION;  Surgeon: Kelsie Lynwood, MD;  Location: [facility];  Service: Cardiovascular;  Laterality: N/A;   CARDIOVERSION N/A 07/07/2020   Procedure: CARDIOVERSION;  Surgeon: Jeffrie Oneil BROCKS, MD;  Location: [facility];  Service: Cardiovascular;  Laterality: N/A;   KNEE ARTHROSCOPY     VASECTOMY  1992       Home Medications    Prior to Admission medications  Medication Sig Start Date End Date Taking? Authorizing Provider  amLODipine (NORVASC) 10 MG tablet Take 10 mg by mouth daily.   Yes [provider]  aspirin  EC 81 MG tablet Take 1 tablet (81 mg total) by mouth daily. Swallow whole. 10/23/20  Yes Monetta Redell PARAS, MD  losartan  (COZAAR ) 50 MG tablet Take 1 tablet (50 mg total) by mouth daily. 05/05/22  Yes Monetta Redell PARAS, MD  metFORMIN (GLUCOPHAGE) 500 MG tablet Take 500 mg by mouth every evening. 02/12/21  Yes [provider]  mupirocin  ointment (BACTROBAN ) 2 % Apply 1 Application topically 2 (two) times daily. 12/11/24  Yes Ival Domino, FNP  sildenafil (VIAGRA) 25 MG tablet Take 100 mg by mouth daily as needed for erectile dysfunction.   Yes [provider]  tadalafil (CIALIS) 5 MG tablet Take 5 mg by mouth daily.  11/20/19  Yes [provider]    Family History Family History  Problem Relation Age of Onset   Breast cancer Mother    Diabetes Mother    Hypertension Mother    Alzheimer's disease Father    Diabetes Father    Stroke Father     Social  History Social History[1]   Allergies   Patient has no known allergies.   Review of Systems Review of Systems  Constitutional:  Negative for fever.  Respiratory:  Negative for cough.   Cardiovascular:  Negative for chest pain.  Gastrointestinal:  Negative for abdominal pain, constipation, diarrhea, nausea and vomiting.  Musculoskeletal:  Positive for arthralgias (Pain and swelling of left distal foot and left great toe.). Negative for back pain.  Skin:  Positive for wound (Left great toe with ecchymosis and some bleeding in the great toe nail is loose at the nailbed.). Negative for color change and rash.  Neurological:  Negative for syncope.  All other systems reviewed and are negative.    Physical Exam Triage Vital Signs ED Triage Vitals  Encounter Vitals Group     BP 12/11/24 0959 (!) 143/83     Girls Systolic BP Percentile --      Girls Diastolic BP Percentile --      Boys Systolic BP Percentile --      Boys Diastolic BP Percentile --      Pulse Rate 12/11/24 0959 75     Resp 12/11/24 0959 16     Temp 12/11/24 0959 (!) 97.1 F (36.2 C)     Temp Source 12/11/24 0959 Oral     SpO2 12/11/24 0959 95 %     Weight --      Height --      Head Circumference --      Peak Flow --      Pain Score 12/11/24 1001 5     Pain Loc --      Pain Education --      Exclude from Growth Chart --    No data found.  Updated Vital Signs BP (!) 143/83 (BP Location: Left Arm)   Pulse 75   Temp (!) 97.1 F (36.2 C) (Oral)   Resp 16   SpO2 95%   Visual Acuity Right Eye Distance:   Left Eye Distance:   Bilateral Distance:    Right Eye Near:   Left Eye Near:    Bilateral Near:     Physical Exam Vitals and nursing note reviewed.  Constitutional:      General: He is not in acute distress.    Appearance: He is well-developed. He is not ill-appearing or toxic-appearing.  HENT:     Head: Normocephalic and atraumatic.     Right Ear: External ear normal.     Left Ear: External ear  normal.     Nose: Nose normal.     Mouth/Throat:     Lips: Pink.     Mouth: Mucous membranes are moist.  Eyes:     Conjunctiva/sclera: Conjunctivae normal.     Pupils: Pupils are equal, round, and reactive to light.  Cardiovascular:     Rate and  Rhythm: Normal rate and regular rhythm.     Heart sounds: S1 normal and S2 normal. No murmur heard. Pulmonary:     Effort: Pulmonary effort is normal. No respiratory distress.     Breath sounds: Normal breath sounds. No decreased breath sounds, wheezing, rhonchi or rales.  Musculoskeletal:        General: No swelling.  Skin:    General: Skin is warm and dry.     Capillary Refill: Capillary refill takes less than 2 seconds.     Findings: No rash.  Neurological:     Mental Status: He is alert and oriented to person, place, and time.  Psychiatric:        Mood and Affect: Mood normal.      UC Treatments / Results  Labs (all labs ordered are listed, but only abnormal results are displayed) [facility] CMP showed an eGFR of 91 from 11/27/24 on the patient's portal with [facility].  Basic metabolic panel: 07/26/22: Order: [id number] Component Ref Range & Units 2 yr ago  Sodium 136 - 144 mmol/L 140  Potassium 3.5 - 5.0 mmol/L 4.1  Chloride 98 - 108 mmol/L 108  CO2 22 - 29 mmol/L 24  BUN 8 - 20 mg/dl 13  Creatinine 9.27 - 8.74 mg/dL 9.06  Calcium 8.4 - 89.6 mg/dl 8.8  Glucose 70 - 859 mg/dL 860  Comment: Glucose Reference Range:     Random:            70-140 mg/dL     Fasting:           70-99 mg/dL     Fasting Impaired:  100-125 mg/dL     Fasting Sugg. DM:  >125 mg/dL  Normal fasting glucose is less than 100 mg/dL; an impaired fasting glucose is 100-125mg /dL.  A fasting glucose repeatedly greater than 125 is characteristic of diabetes mellitus.  Anion Gap 7.0 - 16.0 mmol/L 8  eGFR >=60.0 mL/min/1.74m*2 >60.0  Comment: This calculation was updated on 04/07/2021 and no longer includes a race coefficient.  Reported eGFR  calculation is based on the CKD-EPI 2021 equation that does not use a race coefficient. eGFR Interpretations:   >= 90 ml/min/1.17m2 Normal (G1)   60-89 ml/min/1.36m2 Mildly decreased kidney function (G2)   45-59 ml/min/1.80m2 Mildly to moderately decreased kidney function (G3a)   30-44 ml/min/1.44m2 Moderately to severely decreased kidney function (G3b)   15-29 ml/min/1.50m2 Severely decreased kidney function (G4)   < 15 ml/min/1.15m2 Kidney failure (G5)  Resulting Agency [facility]    EKG   Radiology No results found.  Procedures Procedures (including critical care time)  Medications Ordered in UC Medications - No data to display  Initial Impression / Assessment and Plan / UC Course  I have reviewed the triage vital signs and the nursing notes.  Pertinent labs & imaging results that were available during my care of the patient were reviewed by me and considered in my medical decision making (see chart for details).  Plan of Care (see discharge instructions for additional patient precautions and education): Crush injury/contusion to the left great toe/toenail with left foot pain and great toe pain: X-ray appears to show some arthritic changes and possibly small avulsion chip on the distal first metatarsal.  However the patient does not have bruising or pain there.  Will update the patient once radiology has reviewed the films  Patient is walking with minimal pain and declined a postop shoe.  Encouraged good hard shoes.  Use acetaminophen  or ibuprofen,  OTC, as needed for pain.  Encouraged ice and elevation.  Try to keep the left great toe dry.  May wash in the shower but take the bandages off first.  Do not soak in any kind of tub or bath.  Clean with warm soapy fingers, rinse, pat dry, apply mupirocin  ointment and use a bandage if needed.  The nail is injured and is likely to fall off at some point in the future.  Follow-up with primary care, podiatry or  return here if symptoms do not improve, worsen or new symptoms occur.  I reviewed the plan of care with the patient and/or the patient's guardian.  The patient and/or guardian had time to ask questions and acknowledged that the questions were answered.  Final Clinical Impressions(s) / UC Diagnoses   Final diagnoses:  Great toe pain, left  Left foot pain  Traumatic subungual ecchymosis of left great toe  Contusion of left great toe with damage to nail, initial encounter     Discharge Instructions      Crush injury/contusion to the left great toe/toenail with left foot pain and great toe pain: X-ray appears to show some arthritic changes and possibly small avulsion chip on the distal first metatarsal.  However the patient does not have bruising or pain there.  Will update the patient once radiology has reviewed the films  Patient is walking with minimal pain and declined a postop shoe.  Encouraged good hard shoes.  Use acetaminophen  or ibuprofen, OTC, as needed for pain.  Encouraged ice and elevation.  Try to keep the left great toe dry.  May wash in the shower but take the bandages off first.  Do not soak in any kind of tub or bath.  Clean with warm soapy fingers, rinse, pat dry, apply mupirocin  ointment and use a bandage if needed.  The nail is injured and is likely to fall off at some point in the future.  Follow-up with primary care, podiatry or return here if symptoms do not improve, worsen or new symptoms occur.     ED Prescriptions     Medication Sig Dispense Auth. Provider   mupirocin  ointment (BACTROBAN ) 2 % Apply 1 Application topically 2 (two) times daily. 22 g Ival Domino, FNP      PDMP not reviewed this encounter.    [1]  Social History Tobacco Use   Smoking status: Never    Passive exposure: Never   Smokeless tobacco: Never  Vaping Use   Vaping status: Never Used  Substance Use Topics   Alcohol use: Yes    Alcohol/week: 2.0 - 3.0 standard drinks of alcohol     Types: 2 - 3 Cans of beer per week   Drug use: Never     Ival Domino, FNP 12/11/24 1052  "

## 2025-01-04 DIAGNOSIS — Z13228 Encounter for screening for other metabolic disorders: Secondary | ICD-10-CM | POA: Insufficient documentation

## 2025-01-08 ENCOUNTER — Ambulatory Visit: Admitting: Cardiology

## 2025-01-08 ENCOUNTER — Encounter: Payer: Self-pay | Admitting: Cardiology

## 2025-01-08 VITALS — BP 156/84 | HR 75 | Ht 75.0 in | Wt 270.6 lb

## 2025-01-08 DIAGNOSIS — I480 Paroxysmal atrial fibrillation: Secondary | ICD-10-CM

## 2025-01-08 DIAGNOSIS — I119 Hypertensive heart disease without heart failure: Secondary | ICD-10-CM

## 2025-01-08 DIAGNOSIS — Z01810 Encounter for preprocedural cardiovascular examination: Secondary | ICD-10-CM

## 2025-01-08 DIAGNOSIS — I10 Essential (primary) hypertension: Secondary | ICD-10-CM | POA: Diagnosis not present

## 2025-01-08 NOTE — Patient Instructions (Signed)
